# Patient Record
Sex: Female | Born: 1975 | Race: White | Hispanic: Yes | Marital: Single | Smoking: Never smoker
Health system: Western US, Academic
[De-identification: ages and names within clinical notes are randomized; demographics above are authoritative.]

## PROBLEM LIST (undated history)

## (undated) ENCOUNTER — Ambulatory Visit: Payer: Self-pay | Admitting: Ophthalmology

## (undated) DIAGNOSIS — H3320 Serous retinal detachment, unspecified eye: Secondary | ICD-10-CM

## (undated) HISTORY — DX: Serous retinal detachment, unspecified eye: H33.20

## (undated) SURGERY — REPAIR, ENTROPION
Anesthesia: Monitored Anesthesia Care (MAC) | Site: Face | Laterality: Left

## (undated) MED ORDER — TETRACAINE HCL 0.5 % OP SOLN
1.00 [drp] | OPHTHALMIC | Status: AC
Start: 2018-07-25 — End: 2018-07-25

## (undated) MED ORDER — TETRACAINE HCL 0.5 % OP SOLN
1.00 [drp] | OPHTHALMIC | Status: AC
Start: 2018-08-04 — End: 2018-08-04

## (undated) MED ORDER — TETRACAINE HCL 0.5 % OP SOLN
1.0000 [drp] | OPHTHALMIC | Status: AC
Start: 2018-03-25 — End: 2018-03-25

## (undated) MED ORDER — PREDNISOLONE ACETATE 1 % OP SUSP
1.0000 [drp] | OPHTHALMIC | Status: AC
Start: 2018-03-25 — End: 2018-03-25

## (undated) MED ORDER — MOXIFLOXACIN HCL 0.5 % OP SOLN
1.00 [drp] | OPHTHALMIC | Status: AC
Start: 2018-07-25 — End: 2018-07-25

## (undated) MED ORDER — MOXIFLOXACIN HCL 0.5 % OP SOLN
1.00 [drp] | OPHTHALMIC | Status: AC
Start: 2018-08-04 — End: 2018-08-04

## (undated) MED ORDER — PREDNISOLONE ACETATE 1 % OP SUSP
1.0000 [drp] | Freq: Three times a day (TID) | OPHTHALMIC | 11 refills | Status: AC
Start: 2022-04-11 — End: ?

## (undated) MED ORDER — PREDNISOLONE ACETATE 1 % OP SUSP
1.00 [drp] | OPHTHALMIC | Status: AC
Start: 2018-08-04 — End: 2018-08-04

## (undated) MED ORDER — PHENYLEPHRINE HCL 2.5 % OP SOLN
1.0000 [drp] | OPHTHALMIC | Status: AC
Start: 2018-03-25 — End: 2018-03-25

## (undated) MED ORDER — CYCLOPENTOLATE HCL 1 % OP SOLN
1.0000 [drp] | OPHTHALMIC | Status: AC
Start: 2018-03-25 — End: 2018-03-25

## (undated) MED ORDER — MOXIFLOXACIN HCL 0.5 % OP SOLN
1.0000 [drp] | OPHTHALMIC | Status: AC
Start: 2018-03-25 — End: 2018-03-25

## (undated) MED ORDER — CYCLOPENTOLATE HCL 1 % OP SOLN
1.00 [drp] | OPHTHALMIC | Status: AC
Start: 2018-08-04 — End: 2018-08-04

## (undated) MED ORDER — PHENYLEPHRINE HCL 2.5 % OP SOLN
1.00 [drp] | OPHTHALMIC | Status: AC
Start: 2018-08-04 — End: 2018-08-04

## (undated) MED ORDER — PREDNISOLONE ACETATE 1 % OP SUSP
1.00 [drp] | OPHTHALMIC | Status: AC
Start: 2018-07-25 — End: 2018-07-25

## (undated) MED ORDER — PREDNISOLONE ACETATE 1 % OP SUSP
1.0000 [drp] | Freq: Three times a day (TID) | OPHTHALMIC | 3 refills | Status: AC
Start: 2019-11-12 — End: ?

## (undated) MED ORDER — CYCLOPENTOLATE HCL 1 % OP SOLN
1.0000 [drp] | Freq: Four times a day (QID) | OPHTHALMIC | 0 refills | Status: AC
Start: 2018-05-13 — End: ?

## (undated) MED ORDER — CYCLOPENTOLATE HCL 1 % OP SOLN
1.00 [drp] | OPHTHALMIC | Status: AC
Start: 2018-07-25 — End: 2018-07-25

## (undated) MED ORDER — PHENYLEPHRINE HCL 2.5 % OP SOLN
1.00 [drp] | OPHTHALMIC | Status: AC
Start: 2018-07-25 — End: 2018-07-25

---

## 2018-03-13 ENCOUNTER — Emergency Department
Admission: EM | Admit: 2018-03-13 | Discharge: 2018-03-14 | Disposition: A | Discharge status: Home / Self Care | Payer: Worker's Comp, Other unspecified | Attending: Emergency Medicine | Admitting: Emergency Medicine

## 2018-03-13 ENCOUNTER — Emergency Department: Payer: Worker's Comp, Other unspecified | Admitting: Student in an Organized Health Care Education/Training Program

## 2018-03-13 ENCOUNTER — Emergency Department: Payer: Worker's Comp, Other unspecified

## 2018-03-13 ENCOUNTER — Encounter: Admission: EM | Disposition: A | Discharge status: Home Health | Payer: Self-pay | Attending: Emergency Medicine

## 2018-03-13 ENCOUNTER — Emergency Department (HOSPITAL_BASED_OUTPATIENT_CLINIC_OR_DEPARTMENT_OTHER): Payer: Worker's Comp, Other unspecified | Admitting: Student in an Organized Health Care Education/Training Program

## 2018-03-13 DIAGNOSIS — R51 Headache: Secondary | ICD-10-CM | POA: Insufficient documentation

## 2018-03-13 DIAGNOSIS — E669 Obesity, unspecified: Secondary | ICD-10-CM | POA: Insufficient documentation

## 2018-03-13 DIAGNOSIS — X17XXXA Contact with hot engines, machinery and tools, initial encounter: Secondary | ICD-10-CM

## 2018-03-13 DIAGNOSIS — Y998 Other external cause status: Secondary | ICD-10-CM | POA: Insufficient documentation

## 2018-03-13 DIAGNOSIS — Y9389 Activity, other specified: Secondary | ICD-10-CM | POA: Insufficient documentation

## 2018-03-13 DIAGNOSIS — W228XXA Striking against or struck by other objects, initial encounter: Secondary | ICD-10-CM | POA: Insufficient documentation

## 2018-03-13 DIAGNOSIS — Y9289 Other specified places as the place of occurrence of the external cause: Secondary | ICD-10-CM | POA: Insufficient documentation

## 2018-03-13 DIAGNOSIS — S0532XA Ocular laceration without prolapse or loss of intraocular tissue, left eye, initial encounter: Secondary | ICD-10-CM | POA: Insufficient documentation

## 2018-03-13 LAB — BASIC METABOLIC PANEL, BLOOD
BUN: 12 mg/dL (ref 7–25)
CO2: 25 mmol/L (ref 21–31)
Calcium: 11.4 mg/dL — ABNORMAL HIGH (ref 8.6–10.3)
Chloride: 102 mmol/L (ref 98–107)
Creat: 0.7 mg/dL (ref 0.6–1.2)
Electrolyte Balance: 7 mmol/L (ref 2–12)
Glucose: 97 mg/dL (ref 70–115)
Potassium: 4.1 mmol/L (ref 3.5–5.1)
Sodium: 134 mmol/L — ABNORMAL LOW (ref 136–145)
eGFR - high estimate: >60 (ref >59)
eGFR - low estimate: >60 (ref >59)

## 2018-03-13 LAB — CBC WITH DIFF, BLOOD
ANC automated: 8.4 10*3/uL — ABNORMAL HIGH (ref 2.0–8.1)
Basophils %: 0.7 %
Basophils Absolute: 0.1 10*3/uL (ref 0.0–0.2)
Eosinophils %: 0.3 %
Eosinophils Absolute: 0 10*3/uL (ref 0.0–0.5)
Hematocrit: 36 % (ref 34.0–44.0)
Hgb: 11.9 G/DL (ref 11.5–15.0)
Lymphocytes %: 16.8 %
Lymphocytes Absolute: 1.8 10*3/uL (ref 0.9–3.3)
MCH: 26.9 PG — ABNORMAL LOW (ref 27.0–33.5)
MCHC: 33.2 G/DL (ref 32.0–35.5)
MCV: 81.2 FL — ABNORMAL LOW (ref 81.5–97.0)
MPV: 7.4 FL (ref 7.2–11.7)
Monocytes %: 5.1 %
Monocytes Absolute: 0.6 10*3/uL (ref 0.0–0.8)
Neutrophils % (A): 77.1 %
PLT Count: 424 10*3/uL — ABNORMAL HIGH (ref 150–400)
RBC: 4.44 10*6/uL (ref 3.70–5.00)
RDW-CV: 15.3 % — ABNORMAL HIGH (ref 11.6–14.4)
White Bld Cell Count: 10.9 10*3/uL — ABNORMAL HIGH (ref 4.0–10.5)

## 2018-03-13 LAB — RETAIN BB SAMPLE

## 2018-03-13 LAB — PT/INR/PTT
INR: 1.02 (ref 0.88–1.12)
PTT: 27.9 s (ref 24.3–35.0)
Prothrombin Time: 13.4 s (ref 12.0–14.4)

## 2018-03-13 SURGERY — REPAIR, GLOBE, RUPTURED
Anesthesia: General | Laterality: Left | Wound class: Class I (Clean)

## 2018-03-13 MED ORDER — METHYLPREDNISOLONE SOD SUCCINATE OPHTHALMIC INJ 20 MG/ML (~~LOC~~)
SUBCONJUNCTIVAL | Status: DC | PRN
Start: 2018-03-13 — End: 2018-03-13
  Administered 2018-03-13 (×2): 0.5 mL via SUBCONJUNCTIVAL

## 2018-03-13 MED ORDER — HELP
0.3000 mL | Freq: Once | Status: DC
Start: 2018-03-13 — End: 2018-03-13

## 2018-03-13 MED ORDER — MEPERIDINE HCL 25 MG/ML IJ SOLN
12.5000 mg | INTRAMUSCULAR | Status: DC | PRN
Start: 2018-03-13 — End: 2018-03-14

## 2018-03-13 MED ORDER — PROCEDURAL SEDATION LIDOCAINE 2% - 20 ML (~~LOC~~)
INTRAMUSCULAR | Status: AC
Start: 2018-03-13 — End: 2018-03-13
  Filled 2018-03-13: qty 20

## 2018-03-13 MED ORDER — ACETAMINOPHEN 10 MG/ML IV SOLN
INTRAVENOUS | Status: DC | PRN
Start: 2018-03-13 — End: 2018-03-13
  Administered 2018-03-13: 1000 mg via INTRAVENOUS

## 2018-03-13 MED ORDER — SODIUM CHLORIDE 0.9 % IV SOLN
12.5000 mg | Freq: Once | INTRAVENOUS | Status: DC | PRN
Start: 2018-03-13 — End: 2018-03-14

## 2018-03-13 MED ORDER — METHYLPREDNISOLONE SODIUM SUCC 40 MG IJ SOLR CUSTOM
INTRAMUSCULAR | Status: AC
Start: 2018-03-13 — End: 2018-03-13
  Filled 2018-03-13: qty 40

## 2018-03-13 MED ORDER — VANCOMYCIN HCL 1 GM IV SOLR
1000.0000 mg | Freq: Once | INTRAVENOUS | Status: AC
Start: 2018-03-13 — End: 2018-03-13
  Administered 2018-03-13 (×2): 1000 mg via INTRAVENOUS
  Filled 2018-03-13: qty 1000

## 2018-03-13 MED ORDER — NALOXONE HCL 0.4 MG/ML IJ SOLN
0.2000 mg | Freq: Once | INTRAMUSCULAR | Status: DC | PRN
Start: 2018-03-13 — End: 2018-03-14

## 2018-03-13 MED ORDER — ERYTHROMYCIN 5 MG/GM OP OINT
TOPICAL_OINTMENT | OPHTHALMIC | Status: AC
Start: 2018-03-13 — End: 2018-03-13
  Filled 2018-03-13: qty 3.5

## 2018-03-13 MED ORDER — SUGAMMADEX SODIUM 200 MG/2ML IV SOLN
INTRAVENOUS | Status: AC
Start: 2018-03-13 — End: 2018-03-13
  Filled 2018-03-13: qty 2

## 2018-03-13 MED ORDER — ATROPINE SULFATE 1 % OP SOLN
OPHTHALMIC | Status: AC
Start: 2018-03-13 — End: 2018-03-13
  Filled 2018-03-13: qty 5

## 2018-03-13 MED ORDER — VANCOMYCIN PER PHARMACY (~~LOC~~)
INTRAVENOUS | Status: DC
Start: 2018-03-13 — End: 2018-03-13

## 2018-03-13 MED ORDER — ERYTHROMYCIN 5 MG/GM OP OINT
TOPICAL_OINTMENT | OPHTHALMIC | Status: DC | PRN
Start: 2018-03-13 — End: 2018-03-13
  Administered 2018-03-13: 1 via OPHTHALMIC

## 2018-03-13 MED ORDER — SODIUM HYALURONATE 10 MG/ML IO SOLN
INTRAOCULAR | Status: DC | PRN
Start: 2018-03-13 — End: 2018-03-13
  Administered 2018-03-13 (×2): 0.85 mL via INTRAOCULAR

## 2018-03-13 MED ORDER — TETANUS-DIPHTH-ACELL PERTUSSIS 5-2.5-18.5 LF-MCG/0.5 IM SUSP
0.5000 mL | Freq: Once | INTRAMUSCULAR | Status: AC
Start: 2018-03-13 — End: 2018-03-13
  Administered 2018-03-13: 0.5 mL via INTRAMUSCULAR
  Filled 2018-03-13: qty 0.5

## 2018-03-13 MED ORDER — BUPIVACAINE HCL (PF) 0.75 % IJ SOLN
INTRAMUSCULAR | Status: AC
Start: 2018-03-13 — End: 2018-03-13
  Filled 2018-03-13: qty 30

## 2018-03-13 MED ORDER — LACTATED RINGERS IV BOLUS (~~LOC~~)
1000.0000 mL | Freq: Once | INTRAVENOUS | Status: AC
Start: 2018-03-13 — End: 2018-03-13
  Administered 2018-03-13 (×2): 1000 mL via INTRAVENOUS

## 2018-03-13 MED ORDER — ONDANSETRON HCL 4 MG/2ML IV SOLN
INTRAMUSCULAR | Status: DC | PRN
Start: 2018-03-13 — End: 2018-03-13
  Administered 2018-03-13 (×2): 4 mg via INTRAVENOUS

## 2018-03-13 MED ORDER — CYCLOPENTOLATE HCL 1 % OP SOLN
OPHTHALMIC | Status: DC | PRN
Start: 2018-03-13 — End: 2018-03-13
  Administered 2018-03-13 (×2): 1 [drp] via OPHTHALMIC

## 2018-03-13 MED ORDER — PROPOFOL 200 MG/20ML IV EMUL
INTRAVENOUS | Status: DC | PRN
Start: 2018-03-13 — End: 2018-03-13
  Administered 2018-03-13: 140 mg via INTRAVENOUS

## 2018-03-13 MED ORDER — MOXIFLOXACIN HCL 0.5 % OP SOLN
OPHTHALMIC | Status: DC | PRN
Start: 2018-03-13 — End: 2018-03-13
  Administered 2018-03-13: 1 [drp] via OPHTHALMIC

## 2018-03-13 MED ORDER — LEVOFLOXACIN 750 MG OR TABS
750.0000 mg | ORAL_TABLET | Freq: Every day | ORAL | 0 refills | Status: DC
Start: 2018-03-13 — End: 2018-03-26

## 2018-03-13 MED ORDER — FENTANYL CITRATE (PF) 100 MCG/2ML IJ SOLN
50.0000 ug | INTRAMUSCULAR | Status: DC | PRN
Start: 2018-03-13 — End: 2018-03-14
  Administered 2018-03-14 (×2): 50 ug via INTRAVENOUS
  Filled 2018-03-13: qty 2

## 2018-03-13 MED ORDER — CYCLOPENTOLATE HCL 1 % OP SOLN
OPHTHALMIC | Status: AC
Start: 2018-03-13 — End: 2018-03-13
  Filled 2018-03-13: qty 2

## 2018-03-13 MED ORDER — STERILE WATER FOR INJECTION IJ SOLN
2000.0000 mg | Freq: Once | INTRAMUSCULAR | Status: AC
Start: 2018-03-13 — End: 2018-03-13
  Administered 2018-03-13 (×2): 2000 mg via INTRAVENOUS
  Filled 2018-03-13: qty 2000

## 2018-03-13 MED ORDER — PREDNISOLONE ACETATE 1 % OP SUSP
OPHTHALMIC | Status: AC
Start: 2018-03-13 — End: 2018-03-13
  Filled 2018-03-13: qty 5

## 2018-03-13 MED ORDER — FENTANYL CITRATE (PF) 100 MCG/2ML IJ SOLN
INTRAMUSCULAR | Status: DC | PRN
Start: 2018-03-13 — End: 2018-03-13
  Administered 2018-03-13: 25 ug via INTRAVENOUS
  Administered 2018-03-13: 50 ug via INTRAVENOUS

## 2018-03-13 MED ORDER — OXYCODONE HCL 5 MG OR TABS
5.0000 mg | ORAL_TABLET | ORAL | Status: DC | PRN
Start: 2018-03-13 — End: 2018-03-14

## 2018-03-13 MED ORDER — CYCLOPENTOLATE HCL 1 % OP SOLN
1.0000 [drp] | Freq: Four times a day (QID) | OPHTHALMIC | 0 refills | Status: DC
Start: 2018-03-13 — End: 2018-05-22

## 2018-03-13 MED ORDER — DEXAMETHASONE SODIUM PHOSPHATE 4 MG/ML IJ SOLN (CUSTOM)
INTRAMUSCULAR | Status: DC | PRN
Start: 2018-03-13 — End: 2018-03-13
  Administered 2018-03-13: 4 mg via INTRAVENOUS

## 2018-03-13 MED ORDER — ACETAMINOPHEN 10 MG/ML IV SOLN
INTRAVENOUS | Status: AC
Start: 2018-03-13 — End: 2018-03-13
  Filled 2018-03-13: qty 100

## 2018-03-13 MED ORDER — OXYCODONE HCL 5 MG OR TABS
10.0000 mg | ORAL_TABLET | ORAL | Status: DC | PRN
Start: 2018-03-13 — End: 2018-03-14
  Administered 2018-03-13: 10 mg via ORAL
  Filled 2018-03-13: qty 2

## 2018-03-13 MED ORDER — LACTATED RINGERS IV SOLN
INTRAVENOUS | Status: DC | PRN
Start: 2018-03-13 — End: 2018-03-13
  Administered 2018-03-13: 21:00:00 via INTRAVENOUS

## 2018-03-13 MED ORDER — GENTAMICIN SULFATE 40 MG/ML IJ SOLN
INTRAMUSCULAR | Status: AC
Start: 2018-03-13 — End: 2018-03-13
  Filled 2018-03-13: qty 2

## 2018-03-13 MED ORDER — HYDROCODONE-ACETAMINOPHEN 5-325 MG OR TABS
1.0000 | ORAL_TABLET | Freq: Once | ORAL | Status: DC
Start: 2018-03-13 — End: 2018-03-14

## 2018-03-13 MED ORDER — POVIDONE-IODINE 5 % OP SOLN
OPHTHALMIC | Status: AC
Start: 2018-03-13 — End: 2018-03-13
  Filled 2018-03-13: qty 60

## 2018-03-13 MED ORDER — LIDOCAINE HCL 20 MG/ML IV INJECTION WRAPPED RECORD
INTRAVENOUS | Status: DC | PRN
Start: 2018-03-13 — End: 2018-03-13
  Administered 2018-03-13 (×2): 70 mg via INTRAVENOUS

## 2018-03-13 MED ORDER — CEFAZOLIN OPHTHALMIC INJ 50 MG/ML (~~LOC~~)
SUBCONJUNCTIVAL | Status: DC | PRN
Start: 2018-03-13 — End: 2018-03-13
  Administered 2018-03-13 (×2): 0.5 mL via SUBCONJUNCTIVAL

## 2018-03-13 MED ORDER — CEFTAZIDIME OPHTHALMIC INJ 22.5 MG/ML (~~LOC~~)
0.3000 mL | SUBCONJUNCTIVAL | Status: DC
Start: 2018-03-13 — End: 2018-03-14
  Filled 2018-03-13: qty 0.3

## 2018-03-13 MED ORDER — SODIUM HYALURONATE 10 MG/ML IO SOLN
INTRAOCULAR | Status: AC
Start: 2018-03-13 — End: 2018-03-13
  Filled 2018-03-13: qty 0.85

## 2018-03-13 MED ORDER — CEFTAZIDIME OPHTHALMIC INJ 22.5 MG/ML (~~LOC~~)
SUBCONJUNCTIVAL | Status: DC | PRN
Start: 2018-03-13 — End: 2018-03-13
  Administered 2018-03-13 (×2): 0.3 mL via SUBCONJUNCTIVAL

## 2018-03-13 MED ORDER — HELP
Status: DC | PRN
Start: 2018-03-13 — End: 2018-03-13
  Administered 2018-03-13 (×2): 1 mL via INTRAOCULAR

## 2018-03-13 MED ORDER — POVIDONE-IODINE 5 % OP SOLN
OPHTHALMIC | Status: DC | PRN
Start: 2018-03-13 — End: 2018-03-13
  Administered 2018-03-13: 1 [drp] via OPHTHALMIC

## 2018-03-13 MED ORDER — CEFAZOLIN SODIUM 1 GM IJ SOLR
INTRAMUSCULAR | Status: AC
Start: 2018-03-13 — End: 2018-03-13
  Filled 2018-03-13: qty 1000

## 2018-03-13 MED ORDER — ROCURONIUM BROMIDE 50 MG/5ML IV SOSY
PREFILLED_SYRINGE | INTRAVENOUS | Status: DC | PRN
Start: 2018-03-13 — End: 2018-03-13
  Administered 2018-03-13: 10 mg via INTRAVENOUS
  Administered 2018-03-13: 90 mg via INTRAVENOUS

## 2018-03-13 MED ORDER — LACTATED RINGERS IV SOLN
INTRAVENOUS | Status: DC
Start: 2018-03-13 — End: 2018-03-14

## 2018-03-13 MED ORDER — MOXIFLOXACIN HCL 0.5 % OP SOLN
OPHTHALMIC | Status: AC
Start: 2018-03-13 — End: 2018-03-13
  Filled 2018-03-13: qty 3

## 2018-03-13 MED ORDER — HYDROCODONE-ACETAMINOPHEN 5-325 MG OR TABS
1.0000 | ORAL_TABLET | ORAL | Status: DC | PRN
Start: 2018-03-13 — End: 2018-03-14

## 2018-03-13 MED ORDER — PREDNISOLONE ACETATE 1 % OP SUSP
1.0000 [drp] | Freq: Four times a day (QID) | OPHTHALMIC | 0 refills | Status: DC
Start: 2018-03-13 — End: 2018-06-06

## 2018-03-13 MED ORDER — BSS IO SOLN
INTRAOCULAR | Status: AC
Start: 2018-03-13 — End: 2018-03-13
  Filled 2018-03-13: qty 30

## 2018-03-13 MED ORDER — HELP
Status: DC | PRN
Start: 2018-03-13 — End: 2018-03-13
  Administered 2018-03-13 (×2): .3 mL via INTRAOCULAR

## 2018-03-13 MED ORDER — PROPOFOL 200 MG/20ML IV EMUL
INTRAVENOUS | Status: AC
Start: 2018-03-13 — End: 2018-03-13
  Filled 2018-03-13: qty 20

## 2018-03-13 MED ORDER — MIDAZOLAM HCL (PF) 2 MG/2ML IJ SOLN
INTRAMUSCULAR | Status: AC
Start: 2018-03-13 — End: 2018-03-13
  Filled 2018-03-13: qty 2

## 2018-03-13 MED ORDER — MIDAZOLAM HCL 2 MG/2ML IJ SOLN
INTRAMUSCULAR | Status: DC | PRN
Start: 2018-03-13 — End: 2018-03-13
  Administered 2018-03-13 (×2): 2 mg via INTRAVENOUS

## 2018-03-13 MED ORDER — BSS IO SOLN
INTRAOCULAR | Status: DC | PRN
Start: 2018-03-13 — End: 2018-03-13
  Administered 2018-03-13 (×2): 15 mL via INTRAOCULAR

## 2018-03-13 MED ORDER — NA CHONDROIT SULF-NA HYALURON 40-30 MG/ML IO SOLN
INTRAOCULAR | Status: AC
Start: 2018-03-13 — End: 2018-03-13
  Filled 2018-03-13: qty 0.5

## 2018-03-13 MED ORDER — VANCOMYCIN OPHTHALMIC INJ 10 MG/ML (~~LOC~~)
0.3000 mL | SUBCONJUNCTIVAL | Status: DC
Start: 2018-03-13 — End: 2018-03-14
  Filled 2018-03-13: qty 0.3

## 2018-03-13 MED ORDER — FENTANYL CITRATE (PF) 100 MCG/2ML IJ SOLN
INTRAMUSCULAR | Status: AC
Start: 2018-03-13 — End: 2018-03-13
  Filled 2018-03-13: qty 2

## 2018-03-13 MED ORDER — MOXIFLOXACIN HCL 0.5 % OP SOLN
1.0000 [drp] | Freq: Four times a day (QID) | OPHTHALMIC | 0 refills | Status: DC
Start: 2018-03-13 — End: 2018-05-22

## 2018-03-13 MED ORDER — BSS IO SOLN
INTRAOCULAR | Status: AC
Start: 2018-03-13 — End: 2018-03-13
  Filled 2018-03-13: qty 45

## 2018-03-13 MED ORDER — SUGAMMADEX SODIUM 200 MG/2ML IV SOLN
INTRAVENOUS | Status: DC | PRN
Start: 2018-03-13 — End: 2018-03-13
  Administered 2018-03-13: 200 mg via INTRAVENOUS

## 2018-03-13 MED ORDER — TRIAMCINOLONE ACETONIDE 40 MG/ML IJ SUSP
INTRAMUSCULAR | Status: AC
Start: 2018-03-13 — End: 2018-03-13
  Filled 2018-03-13: qty 1

## 2018-03-13 SURGICAL SUPPLY — 8 items
APPLICATOR COTTON TIP 6" STER (Misc Surgical Supply) ×2 IMPLANT
CANISTER SUCTION POROUS 2000 CC RIGID LOCK LID STANDARD ELBOW PORT SHUTOFF VALVE LATEX FREE DISPOSAB (Misc Medical Supply) ×2 IMPLANT
GLOVES SUPERSEN 7.0 LTX PF (Gloves/gowns) ×2 IMPLANT
NEEDLE 30G X 1/2" REG BEVEL (Needles/punch/cannula/biopsy) ×2 IMPLANT
PACK MINOR REV 7 (Procedure Packs/kits) ×2 IMPLANT
SPEARS SURGICAL (Disp Instruments) ×8 IMPLANT
SYRINGE 5ML LUER LOCK TIP (Misc Medical Supply) ×2 IMPLANT
TOWELS, STERILE BLUE (Drapes/towels) ×2 IMPLANT

## 2018-03-13 NOTE — ED Notes (Signed)
Per pharmacy, vancomycin eye injection must be picked up from pharmacy. Silvi from pre-op made aware.

## 2018-03-13 NOTE — ED Notes (Signed)
Received report from Jake RN.

## 2018-03-13 NOTE — ED Notes (Addendum)
Report given to [facility] RN in pre-op.

## 2018-03-13 NOTE — Anesthesia Postprocedure Evaluation (Signed)
Anesthesia Post Note    Patient: Natalie Webb    Procedure(s) Performed: Procedure(s):  open globe repair of the left eye      Final anesthesia type: General    Patient location: PACU    Post anesthesia pain: adequate analgesia    Mental status: awake, alert  and oriented    Airway Patent: Yes    Last Vitals:    Vitals Value Taken Time   BP 156/84 03/13/2018 11:20 PM   Temp 36.8    Pulse 96 03/13/2018 11:20 PM   Resp 16 03/13/2018 11:20 PM   SpO2 100 % 03/13/2018 11:27 PM       Temperature >35.5: Yes    Post vital signs: stable    Hydration: adequate    N/V:no    Plan of care per primary team.

## 2018-03-13 NOTE — Consults (Signed)
Ophthalmology Consultation    Patient : Natalie Webb; 43 year old  MRN#: [id number]    Reason for Consultation:  Left eye trauma     History of Present Illness:   Natalie Webb is a 43 year old female with no POH with PMH of anemia here for left eye trauma.  Patient works at a [employer].  This morning she reportedly bent down where the rotating printing press hit her left eye.  She does not think anything went into her eye.  She immediately felt pain and loss of vision.     Right eye asymptomatic.     Past Medical History:  History reviewed. No pertinent past medical history.  No past surgical history on file.    Past Ocular History:   None     Medications:  No current facility-administered medications for this encounter.      No current outpatient medications on file.       Allergies:  No Known Allergies    Family History:  No family history on file.    Social History:  Social History     Patient does not qualify to have social determinant information on file (likely too young).   Social History Narrative   . Not on file       Review of Systems: A review of systems was performed and noted to be negative except as stated in history of present illness.    Physical Examination:  Vitals Blood pressure 174/83, pulse 58, temperature 98.4 F (36.9 C), resp. rate 16, height 5' (1.524 m), weight 76.2 kg (167 lb 15.9 oz), last menstrual period 03/13/2018, SpO2 100 %.    General Pain, anxious        Right Eye Left Eye   Visual Acuity 20/30 HM   Pupil ERRL ERRL   Afferent Pupillary Defect None + by reverse   Motility Full Full   Confrontation Visual Field Full HM    Intraocular Pressure 19 Unable         Anterior Segment Exam     Lids / Lashes Normal Mild eyelid edema    Conjunctiva / Sclera White and Quiet Conjunctival injection with prolapsed uveal tissue nasal to cornea    Cornea Clear + D folds   Anterior Chamber Deep and Quiet Soft globe, flattened AC, near 100% hyphema   Iris Round and Flat No view   Lens Tr NS No view        Dilated Funduscopic Exam   (performed after instillation of 2.5% phenylephrine and 1% tropicamide)   Right Eye Left Eye   Vitreous Clear; No Cell No view   Cup-to-disc Ratio 0.7 No view   Optic Nerve Webb margins; No edema No view   Macula Normal No view   Vessels Normal No view   Periphery Normal, no RT/RD, no commotio, no hemorrhages  No view       CT Head prelim read  There is gross deformity of the left globe, suspicious for globe rupture with associated hemorrhage. There is surrounding extraconal hematoma and soft tissue stranding. There is mild stranding surrounding the lateral rectus. There is no evidence of orbital fracture. No radiodense foreign body is identified.    B-scan left eye: unable given concern for open globe    Assessment and Plan:  1. Left eye open globe   Recommendations:  - operating room for globe exploration and repair: R/B/A for surgery discussed, patient consented  - NPO  - IV vancomycin/ceftazidime  -  tetanus booster  - keep eye shield over left eye at all times  - no straining, no heavy lifting  - pre op planning: CBC, coags, BMP, EKG  - eye marked         2. Glaucoma suspect based on C/D ratio  - unknown FHx  - outpatient workup with HVF, OCTnfl once above stabilized         [place] R. Jerline Pain, MD  Ophthalmology Resident, PGY3  Staffed with Attending Randolm Idol     Spanish interpreter #: [phone]

## 2018-03-13 NOTE — ED Notes (Addendum)
Spoke with Silvi from pre-op; said to have medications tubed to ER and can bring pt to OR while medications in progress. Pt refused pain medication at this time.

## 2018-03-13 NOTE — ED EKG Interpretation (Signed)
ED EKG Interpretation    EKG: Normal Sinus Rhythm with Normal Axis and no ischemic changes.

## 2018-03-13 NOTE — ED Provider Notes (Signed)
EMERGENCY DEPARTMENT VISIT NOTE    CHIEF COMPLAINT  Eye Pain (0700 pt was at work and hit left eye, was seen by a specialist and was told she may need surgery, pt states unable to see from left eye )      HISTORY OF PRESENT ILLNESS:   Natalie Webb is a 43 year old female who presents with eye trauma. Patient was working, at [employer], patient was working on a machine that spins, part of the machine hit her face and she fell backwards onto bottom. Complains of L eye. Patient could not see after the event. Sustained laceration to the eye.     Patient went to other hosptial, sent here for optho evaluation.     Onset: 11 hours  Position: L eye  Quality: Sharp   Radiation: Head  Severity: Moderate  Timing: Worsening    REVIEW OF SYSTEMS:  Constitutional: - fever  Head: + headache  CV: - chest pain  Resp: - shortness of breath  GI: - vomiting  Back: - back pain  Skin: - rash  Neuro: - focal weakness    All other systems reviewed and negative except as noted above    PAST MEDICAL HISTORY:  Anemia    SURGICAL HISTORY:  C/s section    ALLERGIES:  No Known Allergies      CURRENT MEDICATIONS:   Iron    FAMILY HISTORY:  Reviewed and noncontributory      SOCIAL HISTORY:  - Tobacco  - Alcohol  - Drug use    VITAL SIGNS:  First Vitals [03/13/18 1551]   Temperature Heart Rate Respirations Blood pressure (BP) SpO2   98.4 F (36.9 C) 58 16 174/83 100 %       PHYSICAL EXAM:  Gen: awake, alert, appears stated age    Head: normocephalic, atraumatic, moist mucous membranes    Neck: no swelling, full ROM    Eyes: L eye with hyphema, 50%, unable to visualize pupil, R eye with full vision, reactive pupil  ENT: normal appearing ears externally, normal appearing nose externally    CV: normal  rate, skin is warm and well perfused  Pulm: normal chest rise, breathing comfortably, no audible wheezing or stridor    Abdomen: Soft, non-distended,   Extremities: no peripheral edema, cyanosis, or clubbing    Skin: no rashes or significant lesions visible       Neuro: awake, alert, moving all extremities    Psych: conversing appropriately, normal affect      MEDICAL DECISION MAKING:  Natalie Webb is a 43 year old female who presents with L eye trauma, decreased vision.   VS: WNL  Exam: Hyphema, decreased visual acuity    Differential Diagnosis Includes: rupture, laceration, hyphema, hematoma    Plan:  - Labs  - CT head  - Optho consult    ED Course:  I reviewed the patient's labs which showed all labs without clinically significant abnormalities   I reviewed the patient's imaging which showed concern for open globe.  Opthalmology was consulted who states patient will need to go to the OR for operative management.    I have also reviewed prior records from OSH which were summarized in my HPI.  Started on vancomycin, ceftazidime, made NPO and placed on maintenance fluid.     Additional ED Course Documentation:  Workup Review as of Mar 13 2124   Fransico Him R's Documentation   Thu Mar 13, 2018   1610 Per optho, patient has ruptured globe  and will need operative management. Will admit to OR. Will keep NPO.       1722 Optho called, will come evaluate.          Others' Documentation   Thu Mar 13, 2018   1848 Ruptured globe on CT. Per ophtho, needs OR. Needs EKG, vanc/ceftaz.     [TM]      Workup Review User Index  [TM] Myatt, Roxanne Mins, MD        Clinical Impression:  Patient's presentation most consistent with globe ruptured.     Dispo:  - Admit to OR    DIAGNOSIS:    ICD-10-CM ICD-9-CM   1. Ruptured globe of left eye, initial encounter S05.32XA 871.0   2. Ruptured globe of left eye, initial encounter S05.32XA 871.0       ATTENDING ATTESTATION:  I saw and evaluated the patient and discussed the case with the resident/NP.   I agree with the final findings and plan as documented in the record.   We formulated the assessment and plan together.  My additions or revisions are included in the record.    Fransico Him MD        Kem Boroughs, MD  03/13/18 2126

## 2018-03-13 NOTE — ED Triage Notes (Signed)
Pt appears sleepy and states she had morphine IV from the other hospital PTA.

## 2018-03-13 NOTE — ED Notes (Signed)
Optho MD transporting patient to [facility] eye room.

## 2018-03-13 NOTE — Anesthesia Preprocedure Evaluation (Addendum)
ANESTHESIA PRE-OPERATIVE EVALUATION    Patient Information    Name: Natalie Webb    MRN: [id number]    DOB: [birth year]    Age: 43 year old    Sex: female  Procedure(s):  REPAIR, GLOBE, RUPTURED      Pre-op Vitals:   BP 180/80 (BP Location: Right arm, BP Patient Position: Semi-Fowlers)   Pulse 75   Temp 36.7 C   Resp 17   Ht 5' (1.524 m)   Wt 76.2 kg (167 lb 15.9 oz)   LMP 03/13/2018   SpO2 98%   BMI 32.81 kg/m         Primary language spoken:  Spanish    ROS/Medical History:       History of Present Illness: 43 yo F with left ruptured globe 2/27 sf repair of left ruptured globe        General:  positive for Obesity,  #Social  -Nonsmoker  -No alcohol  -No drugs   Cardiovascular:  negative cardio ROS  EKG (03/13/18): NSR, 63 bpm   Anesthesia History:  #PShx  -C/s Pulmonary:   negative pulmonary ROS     Neuro/Psych:   #Rupture, left globe    -[facility] 2/27: Gross deformity of the left globe, suspicious for globe rupture with associated hemorrhage. There is surrounding extraconal hematoma and soft tissue stranding. No evidence of orbital fracture. No radiodense foreign body is identified.    No acute or focal significant intracranial abnormality. Hematology/Oncology:   anemia,      GI/Hepatic:  negative GI/hepatic ROS Infectious Disease:  open wound (L globe),     Renal:  negative renal ROS   Endocrine/Other:  negative endo/other ROS     Pregnancy History:  Currently pregnant: no,   Pediatrics:    negative pediatric ROS       Pre Anesthesia Testing (PCC/CPC) notes/comments:                          NPO at noon-fruit, melon and water. Harris 03/13/18               Physical Exam    Airway:  Inter-inciser distance > 4 cm  Prognanth Unable    Mallampati: III  Neck ROM: full  TM distance: > 6 cm  Short thick neck: No        Cardiovascular:    Rhythm: regular   Rate: normal         Pulmonary:  - pulmonary exam normal           Neuro/Neck/Skeletal/Skin:      Dental:  - normal exam    Abdominal:              Last  OSA (STOP  BANG) Score:  No data recorded    Last OSA  (STOP) Score for   No data recorded                 History reviewed. No pertinent past medical history.  No past surgical history on file.  Social History     Tobacco Use   . Smoking status: Never Smoker   . Smokeless tobacco: Never Used   Substance Use Topics   . Alcohol use: Never     Frequency: Never   . Drug use: Not on file       Current Facility-Administered Medications   Medication Dose Route Frequency Provider Last Rate Last Dose   . [MAR Hold] cefTAZidime 22.5 mg/mL  ophthalmic injection 0.3 mL  0.3 mL Intravitreal On Call to [place], Gwenette Greet, MD       . Doug Sou Hold] HYDROcodone-acetaminophen (NORCO) 5-325 MG tablet 1 tablet  1 tablet Oral Once Sherre Scarlet, MD   Stopped at 03/13/18 1855   . vancomycin (VANCOCIN) 1,000 mg in sodium chloride 0.9 % 250 mL IVPB  1,000 mg IntraVENOUS Once Myatt, Toby C, MD 250 mL/hr at 03/13/18 1949 1,000 mg at 03/13/18 1949   . [MAR Hold] vancomycin 10 mg/mL ophthalmic injection 0.3 mL  0.3 mL Intravitreal On Call to OR Vu, Gwenette Greet, MD         No Known Allergies    Labs and Other Data  Lab Results   Component Value Date    SODIUM 134 (L) 03/13/2018    K 4.1 03/13/2018    CL 102 03/13/2018    BUN 12 03/13/2018    CREAT 0.7 03/13/2018    GLU 97 03/13/2018    [place] 11.4 (H) 03/13/2018     No results found for: AST, ALT, GGT, LDH, ALK, TP, ALB, TBILI, DBILI  Lab Results   Component Value Date    RBC 4.44 03/13/2018    HGB 11.9 03/13/2018    HCT 36.0 03/13/2018    MCV 81.2 (L) 03/13/2018    MCHC 33.2 03/13/2018    RDW 15.3 (H) 03/13/2018    PLT 424 (H) 03/13/2018    MPVH 7.4 03/13/2018     Lab Results   Component Value Date    INR 1.02 03/13/2018    PTT 27.9 03/13/2018     No results found for: ARTPH, ARTPO2, ARTPCO2    Anesthesia Plan:  Risks and Benefits of Anesthesia  I personally examined the patient immediately prior to the anesthetic and reviewed the pertinent medical history, drug and allergy history, laboratory and imaging  studies and consultations. I have determined that the patient has had adequate assessment and testing.    Anesthetic techniques, invasive monitors, anesthetic drugs for induction, maintenance and post-operative analgesia, risks and alternatives have been explained to the patient and/or patient's representatives.    I have prescribed the anesthetic plan:         Planned anesthesia method: General         ASA 2 (Mild systemic disease)     Potential anesthesia problems identified and risks including but not limited to the following were discussed with patient and/or patient's representative: Injury to brain, heart and other organs    No Beta Blocker Indicated: Does not meet criteriaPlanned monitoring method: Routine monitoring    Informed Consent:  Interpreter used: Over phone  Anesthetic plan and risks discussed with Patient and Other.

## 2018-03-13 NOTE — ED Notes (Signed)
Spoke with patient, states she was at work when something injured her left eye. Asked patient if she can see; states " A little bit." Pt is a/ox4, breathing is even and unlabored on room air.

## 2018-03-13 NOTE — Anesthesia Procedure Notes (Signed)
Intubation  Date/Time: 03/13/2018 8:55 PM  Performed by: Carolynn Comment, MD  Authorized by: Osvaldo Angst, MD     Procedure Details:     Pre-oxygenated: Yes      Induction:  IV and RSI    Blade:  Glidescope 3    View Grade:  1    Tube type:  Oral cuffed    Tube size (mm):  6.5    Tube Secured at (cm):  20    Cricoid pressure: Yes      Stylet: Yes      Bougie Used: No      Dental damage: No      Lip/Other damage: No      Bite block inserted:  Gauze    ET-CO2 present: Yes      Breath sounds equal: Yes      Eyes taped: Yes

## 2018-03-13 NOTE — Op Note (Signed)
Eye Globe Rupture Repair Operative Note     DATE OF OPERATION: 03/13/18       PREOPERATIVE DIAGNOSIS:  Ruptured globe, left eye.     POSTOPERATIVE DIAGNOSIS:  Ruptured globe, left eye.     PROCEDURE PERFORMED: Left eye globe rupture repair with anterior chamber washout     SURGEON: Molli Hazard MD     ASSISTANT SURGEON: Carolynn Serve MD, PGY-4     ANESTHESIA:  General.     COMPLICATIONS:  None  .  ESTIMATED BLOOD LOSS:  Less than 5 mL.     DISPOSITION:  To the recovery room in good condition  .  INDICATIONS FOR PROCEDURE:  This is a 43 year old year old female presenting for left eye ruptured globe from work-related left eye trauma. Vision in the involved eye upon arrival to the emergency room was hand motion. Patient was noted to have an open globe with a nasal scleral laceration with prolapsed uveal tissue and a 100% hyphema.         DESCRIPTION OF OPERATION:   After informed consent had been obtained, the patient was taken back to the operating room where cardiac and blood pressure monitoring devices were applied. The patient was gently prepped with Betadine paint for procedure of the  involved eye. Special care was taken not to put pressure on the globe, or allow betadine to enter the globe. A lid speculum was gently inserted, and on examination, it was apparent that the laceration extended perilimbally from 1 to  9 oc along the limbus and extended nasally into the scleral about 10 mm. Uveal tissue (possibly iris and ciliary body) had prolapsed through the wound. The Anterior chamber was noted to have a dense 100% hyphema.     A 180-degree peritomy was performed taking care not to put pressure on the globe.   Wescott scissors were used to dissect down to bare sclera and posteriorly in all four quadrants. No other lacerations were noted on the globe. A cyclodialysis spatula was then used to reposition the prolapsed uveal tissue, likely iris and ciliary body, inside the anterior and posterior chamber. 9.0 nylon  sutures were used to close the limbus. 8.0 nylon sutures were used to close the scleral laceration, taking care to avoid incarcerating the prolapsed uveal tissue.     Before all sutures were placed, the anterior chamber was irrigated with basic saline solution on a cannula in attempt to washout the hyphema. The hyphema had formed a densely coagulated clot, and was therefore very resistant to removal. The anterior chamber was washed several times however the entirety of the anterior chamber blood clot could not be removed safely. The decision was made to treat the clot medically post operatively. The anterior chamber at this time was noted to be formed, with persistent hyphema. Multiple interrupted sutures were placed until the wounds were securely closed. All wounds were confirmed to be watertight.    Next, attention was turned toward the conjunctival peritomy. The peritomy was then sutured closed, such that the conjunctiva was well apposed at the end of the case with 7.0 vicryl. Betadine was placed on the eye. Intravitreal vancomycin (0.7 mL) and ceftazidime (0.7 mL) was injected. Injections of Ancef and Solumedrol were administered into the subconjunctival space inferotemporally. The surface of the eye was washed off with balance salt saline. Antibiotic drops and steroids and cyclopentolate was given. Erythromycin Ointment was placed on the eye. A pressure patch was placed on the eye with an overlying  eye shield. The IOP at this time was estimated to be about . The patient was extubated and taken back to the recovery room in good condition and to followup within 1 day.

## 2018-03-13 NOTE — ED Notes (Signed)
Pt transport to main OR via w/c. Pt a/ox4, breathing is even and unlabored on room air

## 2018-03-13 NOTE — ED Notes (Signed)
Report given to Philip in OR

## 2018-03-13 NOTE — Discharge Instructions (Signed)
Discharge Instructions: Open Globe Repair, Left Eye    What to expect    After a major eye surgery, it is normal for the eye to be red, mildly painful, and very blurry. The eye will also likely feel like there is something stuck in it. It is also normal after general anesthesia to experience nausea, vomiting, headache, light-headedness, and dizziness.    Medications    For the first 24 hours after surgery, you do not need to administer any eye drops. Just leave the eye shield in place until you see your surgeon the day after surgery.    Take Levaquin orally daily for 10 days, this has been sent to your pharmacy for pickup.    For pain, take Acetaminophen (Tylenol) 325-650 mg every 4 hours if you have no history of liver problems.    After you see your surgeon the day after surgery, start using your eye drop medications as follows unless instructed differently:        Left EYE       Prednisolone   (White or Pink Top)   4 x / day     Ofloxacin   (Brown Top)   4 x / day     Cyclopentolate   (Red Top)   4 x / day     Wound care/Activity Restrictions    Leave your eye shield/patch on until follow up tomorrow. If you develop any discharge or crusting, you can gently clean the area with a clean, damp wash cloth.     Take the following precautions for at least 2 weeks until instructed otherwise by your surgeon:     Do not rub your eye   Wear your protective eye shield while sleeping at night    No heavy lifting, bending over at the waist, or straining   Do not play or work in dirt or sand   Do not swim or submerge the eye under water   Shower and/or bathe from the neck down only   Wash hair backwards, keeping the eye and the skin around the eye dry   If possible, sleep on several pillows to keep your head elevated   Wear protective dark sunglasses when outdoors     It is OK to resume your usual diet, watch TV, read, etc.      When to call and return promptly    If you develop sudden decreased vision, eye pain,  redness, flashing lights, new or worsening floaters, or a shadow/curtain moving in front of or covering your vision, you need to be examined immediately. During business hours, call the eye clinic. At night or on weekends, go to the emergency room.    Phone Numbers  [phone] Kennon Holter   [phone] [facility]    Follow up  The patient is to follow-up in our clinic on Friday 03/14/2017 at 1 PM with Dr. Renold Don at:  [facility], 2nd floor  [address] [place], [place]  [phone]    It is important for you to follow up and be compliant, otherwise you may risk permanent, irreversible vision loss.

## 2018-03-14 ENCOUNTER — Ambulatory Visit: Payer: Worker's Comp, Other unspecified | Attending: Ophthalmology

## 2018-03-14 DIAGNOSIS — S0532XA Ocular laceration without prolapse or loss of intraocular tissue, left eye, initial encounter: Principal | ICD-10-CM | POA: Insufficient documentation

## 2018-03-14 LAB — ECG 12-LEAD
P AXIS: 96 Deg
PR INTERVAL: 147 ms
QRS INTERVAL/DURATION: 96 ms
QT: 396 ms
QTc (Bazett): 403 ms
R AXIS: 0 Deg
R-R INTERVAL AVERAGE: 949 ms
T AXIS: 22 Deg
VENTRICULAR RATE: 63 {beats}/min

## 2018-03-14 MED ORDER — MOXIFLOXACIN HCL 400 MG OR TABS
400.0000 mg | ORAL_TABLET | Freq: Every day | ORAL | 0 refills | Status: AC
Start: 2018-03-14 — End: 2018-03-24

## 2018-03-14 NOTE — Plan of Care (Signed)
Problem: Promotion of Perioperative Health and Safety  Goal: Promotion of Health and Safety of the Perioperative Patient  Description  The patient remains safe, receives treatment appropriate to the surgical intervention and patient's physiological needs and is discharged or transferred to the appropriate level of care.  Outcome: Progressing  Flowsheets (Taken 03/14/2018 0011)  Standard of Care: PACU SOC  Outcome Evaluation (rationale for progressing/not progessing: vital signs stable; pain better after pain med given  Individualized Interventions and Recommendations: assess for post op pain and offer painmeds as needed  Individualized Interventions/Recommendations: monitor vital signs

## 2018-03-14 NOTE — Progress Notes (Signed)
B-scan left eye 03/14/2018: formed round globe with disorganized posterior segment, many vitreous opacities/hemorrhage and likely retinal detachment    Assessment and Plan:  1. 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  Vision potential: hand motion on presentation, currently light perception  S/p repair 03/13/2018  Limbal laceration extending from 1-9oc superonasally with scleral extension nasally  IOP acceptable, globe formed today with poor view posteriorly  Bscan showing disorganized posterior segment, like hemorrhages, possible retinal detachment    Plan:  - Keep head of bed elevated; avoid blood thinners, excessive straining, heavy lifting, bending for 2-3 weeks at least  - Retinal detachment and endophthalmitis return precautions explained  - no eye rubbing, eye shield at night, no getting eye wet or dirt into eye  - Start drops:   Vigamox QID OS   Prednisolone QID OS   Cyclopentolate QID OS  - Oral Antibiotics: Moxifloxacin 400 mg daily x 1 week    - Strict return precautions given for any worsening in symptoms, including pain and vision changes  The patient is to follow-up in our clinic on 03/20/2018 retina clinic for bscan  [facility], 2nd floor  [address] [place], [place]  [phone]             2. Glaucoma suspect based on C/D ratio  - unknown FHx  - outpatient workup with HVF, OCTnfl once above stabilized     Carolynn Serve, MD  PGY4, Ophthalmology  Seen with Dr. Katrinka Blazing  Discussed with Dr. Randolm Idol    The patient was provided with a follow-up card with the above information. The pathophysiology of the above diagnosis was explained to the patient, the importance of compliance and follow-up, as well as the possibility of irreversible vision loss if patient does not follow-up or is not compliant. The patient endorsed understanding.

## 2018-03-14 NOTE — Anesthesia Postprocedure Evaluation (Signed)
Anesthesia Post Note    Patient: Natalie Webb    Procedure(s) Performed: Procedure(s):  open globe repair of the left eye      Final anesthesia type: General    Patient location: PACU    Post anesthesia pain: adequate analgesia    Mental status: awake, alert  and oriented    Airway Patent: Yes    Last Vitals:    Vitals Value Taken Time   BP 144/80 03/14/2018 12:30 AM   Temp 36.2 C 03/13/2018 11:45 PM   Pulse 75 03/14/2018 12:30 AM   Resp 14 03/14/2018 12:30 AM   SpO2 96 % 03/14/2018 12:30 AM        Temperature >35.5: Yes    Post vital signs: stable    Hydration: adequate    N/V:no    Plan of care per primary team.

## 2018-03-20 ENCOUNTER — Ambulatory Visit: Payer: Worker's Comp, Other unspecified | Attending: Ophthalmology

## 2018-03-20 ENCOUNTER — Telehealth: Payer: Self-pay

## 2018-03-20 ENCOUNTER — Ambulatory Visit: Payer: Self-pay

## 2018-03-20 DIAGNOSIS — S0532XA Ocular laceration without prolapse or loss of intraocular tissue, left eye, initial encounter: Secondary | ICD-10-CM | POA: Insufficient documentation

## 2018-03-20 DIAGNOSIS — H40001 Preglaucoma, unspecified, right eye: Secondary | ICD-10-CM | POA: Insufficient documentation

## 2018-03-20 DIAGNOSIS — S0532XD Ocular laceration without prolapse or loss of intraocular tissue, left eye, subsequent encounter: Secondary | ICD-10-CM | POA: Insufficient documentation

## 2018-03-20 MED ORDER — PREDNISONE 10 MG OR TABS
20.0000 mg | ORAL_TABLET | Freq: Every day | ORAL | 0 refills | Status: DC
Start: 2018-03-20 — End: 2018-03-27

## 2018-03-20 NOTE — Telephone Encounter (Addendum)
Patient missed appt today in the retina clinic. She is Natalie Webb to 04/17/18 but is requesting something sooner since her eye is bothering her please call her at [phone]

## 2018-03-20 NOTE — Telephone Encounter (Signed)
[facility] is calling to see if we can offer the pt something sooner states they pt sutra seem to be coming out xfer to clinic spoke w/ [facility]

## 2018-03-20 NOTE — Progress Notes (Addendum)
B-scan left eye 03/14/2018: formed round globe with disorganized posterior segment, many vitreous opacities/hemorrhage and likely retinal detachment    Assessment and Plan:  1. 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 Erroll Luna  Will start patient on po pred 40 mg for pain, will have pt follow up 03/24/18, poor surgical prognosis. Will also have glaucoma service eval at that time as well   Continue pred forte, vigamox, and cyclo QID     Plan:  - Keep head of bed elevated; avoid blood thinners, excessive straining, heavy lifting, bending for 2-3 weeks at least  - Retinal detachment and endophthalmitis return precautions explained  - no eye rubbing, eye shield at night, no getting eye wet or dirt into eye    - Oral Antibiotics: Moxifloxacin 400 mg daily x 1 week    - Strict return precautions given for any worsening in symptoms, including pain and vision changes               2. 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                03/24/18 PM to see Retina special arrangements - poor visual prognosis and  surgical prognosis after review w Dr. Erroll Luna, but will have pt be seen again  for evaluation and monitor for pain    I reviewed and confirmed the techs ROS, past histories, and  readings. I saw and examined the patient and reviewed in detail the findings of the resident/fellow. The final examination findings, image interpretations, and plan as documented in the record represent my personal judgment and conclusions.     Molli Hazard, MD  Vitreoretinal Surgery Fellow  [facility]

## 2018-03-20 NOTE — Progress Notes (Deleted)
1. 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  -Vision potential: hand motion on presentation, currently light perception  -Limbal laceration extending from 1-9oc superonasally with scleral extension nasally  -IOP acceptable, globe formed today with poor view posteriorly  -Bscan showing disorganized posterior segment, like hemorrhages, possible retinal detachment    Plan:  - Keep head of bed elevated; avoid blood thinners, excessive straining, heavy lifting, bending for 2-3 weeks at least  - Retinal detachment and endophthalmitis return precautions explained  - no eye rubbing, eye shield at night, no getting eye wet or dirt into eye  - Start drops:              Vigamox QID OS              Prednisolone QID OS              Cyclopentolate QID OS  - Oral Antibiotics: Moxifloxacin 400 mg daily x 1 week    Greta Doom PGY3  Seen with Dr.

## 2018-03-20 NOTE — Telephone Encounter (Signed)
Natalie Webb scheduled her for today

## 2018-03-24 ENCOUNTER — Ambulatory Visit: Payer: Worker's Comp, Other unspecified | Attending: Retina Specialist

## 2018-03-24 ENCOUNTER — Encounter: Payer: Self-pay | Admitting: Student in an Organized Health Care Education/Training Program

## 2018-03-24 DIAGNOSIS — S0532XD Ocular laceration without prolapse or loss of intraocular tissue, left eye, subsequent encounter: Principal | ICD-10-CM | POA: Insufficient documentation

## 2018-03-24 DIAGNOSIS — H40001 Preglaucoma, unspecified, right eye: Secondary | ICD-10-CM | POA: Insufficient documentation

## 2018-03-24 NOTE — Progress Notes (Signed)
Discussed patient with Dr. Raiford Noble and will plan for surgery this week. Used interpreter phone to call patient at home number and cell number [phone] and [phone]. Left messages asking patient to come to clinic to meet with Dr. Randolm Idol on 3/10 at 12:30pm for repeat exam and to discuss plan for surgery.

## 2018-03-24 NOTE — Progress Notes (Signed)
B-scan left eye 03/14/2018: formed round globe with disorganized posterior segment, many vitreous opacities/hemorrhage and likely retinal detachment    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 Erroll Luna)    Today:   [facility], IOP 10, 3+ APD by reverse. Examined patient and reviewed prior B scan from 03/20/18 with Dr. Lavell Islam. 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 Discussed option of conservative management vs surgery with repair of RD/AC washout and patient wishes to pursue all measures that may allow her to regain some vision in this eye. I explained that there are no guarantees of improved vision and patient expressed understanding. Given mild corneal blood staining, surgery indicated within the next week.     Plan:  - will discuss plan for surgery with Drs. Puri and Hadi  - Continue pred forte, vigamox, and cyclo QID   - Continue PO pred 40 mg for pain  - Keep head of bed elevated; avoid blood thinners, excessive straining, heavy lifting, bending for 2-3 weeks at least  - Retinal detachment and endophthalmitis return precautions explained  - no eye rubbing, eye shield at night, no getting eye wet or dirt into eye  - Oral Antibiotics: Moxifloxacin 400 mg daily x 1 week  - Strict return precautions given for any worsening in symptoms, including pain and vision changes    2. 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 Shaaron Adler Spanish [phone]    I reviewed and confirmed all history components, including the HPI; and any other elements performed by the technician. I performed the key and critical portions of the exam. The final examination findings, image interpretations, and plan as documented in the record represent my personal judgement and conclusion.     Dorann Lodge MD.

## 2018-03-25 ENCOUNTER — Ambulatory Visit: Payer: Worker's Comp, Other unspecified

## 2018-03-25 ENCOUNTER — Ambulatory Visit: Payer: Worker's Comp, Other unspecified | Attending: Ophthalmology

## 2018-03-25 DIAGNOSIS — S0532XD Ocular laceration without prolapse or loss of intraocular tissue, left eye, subsequent encounter: Secondary | ICD-10-CM | POA: Insufficient documentation

## 2018-03-25 DIAGNOSIS — H33052 Total retinal detachment, left eye: Secondary | ICD-10-CM | POA: Insufficient documentation

## 2018-03-25 DIAGNOSIS — H40001 Preglaucoma, unspecified, right eye: Secondary | ICD-10-CM | POA: Insufficient documentation

## 2018-03-25 NOTE — Progress Notes (Signed)
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 Erroll Luna)    Today:   [facility], IOP 03, 3+ APD by reverse. Examined patient and reviewed prior B scan from 03/20/18 with Dr. Lavell Islam and reviewed new B-scan today 03/25/18 with funnel RD.    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 Discussed option of conservative management vs surgery with repair of RD/AC washout and patient wishes to pursue all measures that may allow her to regain some vision in this eye. I explained that there are no guarantees of improved vision and patient expressed understanding. Given poor prognosis and high risk of scarring, will attempt to proceed urgently.     Plan:  Risks, benefits and alternatives including no intervention, observation and treatment options were presented and discussed in detail with the patient.   The patient elects to proceed with surgery    Informed consent obtained. Surgery order placed.     - Continue pred forte, vigamox, and cyclo QID   - Continue PO pred 40 mg for pain  - Keep head of bed elevated; avoid blood thinners, excessive straining, heavy lifting, bending for 2-3 weeks at least  - Retinal detachment and endophthalmitis return precautions explained  - no eye rubbing, eye shield at night, no getting eye wet or dirt into eye  - Oral Antibiotics: Moxifloxacin 400 mg daily x 1 week  - Strict return precautions given for any worsening in symptoms, including pain and vision changes    2. 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 I saw and examined the patient and reviewed in detail the findings of the resident/fellow. The final examination findings, image interpretations, and plan as documented in the record represent my personal judgment and conclusions.     Jaeline Whobrey M. Raiford Noble, MD, PhD  Vitreoretinal Surgery Fellow  [facility]

## 2018-03-26 ENCOUNTER — Ambulatory Visit
Admission: RE | Admit: 2018-03-26 | Discharge: 2018-03-26 | Disposition: A | Discharge status: Home / Self Care | Payer: Worker's Comp, Other unspecified | Attending: Retina Specialist | Admitting: Retina Specialist

## 2018-03-26 ENCOUNTER — Ambulatory Visit (HOSPITAL_BASED_OUTPATIENT_CLINIC_OR_DEPARTMENT_OTHER): Payer: Worker's Comp, Other unspecified

## 2018-03-26 ENCOUNTER — Ambulatory Visit: Payer: Worker's Comp, Other unspecified

## 2018-03-26 ENCOUNTER — Encounter: Admission: RE | Disposition: A | Discharge status: Home Health | Payer: Self-pay | Attending: Retina Specialist

## 2018-03-26 DIAGNOSIS — E669 Obesity, unspecified: Secondary | ICD-10-CM | POA: Insufficient documentation

## 2018-03-26 DIAGNOSIS — H2102 Hyphema, left eye: Secondary | ICD-10-CM | POA: Insufficient documentation

## 2018-03-26 DIAGNOSIS — H3322 Serous retinal detachment, left eye: Secondary | ICD-10-CM | POA: Insufficient documentation

## 2018-03-26 DIAGNOSIS — S0532XD Ocular laceration without prolapse or loss of intraocular tissue, left eye, subsequent encounter: Secondary | ICD-10-CM | POA: Insufficient documentation

## 2018-03-26 DIAGNOSIS — H33052 Total retinal detachment, left eye: Secondary | ICD-10-CM | POA: Insufficient documentation

## 2018-03-26 DIAGNOSIS — H31302 Unspecified choroidal hemorrhage, left eye: Secondary | ICD-10-CM | POA: Insufficient documentation

## 2018-03-26 HISTORY — PX: OTHER PROCEDURE: U1053

## 2018-03-26 SURGERY — REPAIR, RETINAL DETACHMENT
Anesthesia: General | Site: Eye | Laterality: Left | Wound class: Class I (Clean)

## 2018-03-26 MED ORDER — ATROPINE SULFATE 1 % OP SOLN
OPHTHALMIC | Status: DC | PRN
Start: 2018-03-26 — End: 2018-03-26
  Administered 2018-03-26: 2 [drp] via OPHTHALMIC

## 2018-03-26 MED ORDER — LEVOFLOXACIN 750 MG OR TABS
750.0000 mg | ORAL_TABLET | Freq: Every day | ORAL | 0 refills | Status: AC
Start: 2018-03-26 — End: 2018-04-05

## 2018-03-26 MED ORDER — BALANCED SALT IO SOLN
INTRAOCULAR | Status: DC | PRN
Start: 2018-03-26 — End: 2018-03-26
  Administered 2018-03-26 (×2): 500 mL

## 2018-03-26 MED ORDER — PROPOFOL 200 MG/20ML IV EMUL
INTRAVENOUS | Status: DC | PRN
Start: 2018-03-26 — End: 2018-03-26
  Administered 2018-03-26: 150 mg via INTRAVENOUS
  Administered 2018-03-26: 20 mg via INTRAVENOUS
  Administered 2018-03-26 (×2): 30 mg via INTRAVENOUS

## 2018-03-26 MED ORDER — OXYCODONE HCL 5 MG OR TABS
5.0000 mg | ORAL_TABLET | ORAL | Status: DC | PRN
Start: 2018-03-26 — End: 2018-03-26
  Administered 2018-03-26: 5 mg via ORAL

## 2018-03-26 MED ORDER — ROCURONIUM BROMIDE 50 MG/5ML IV SOSY
PREFILLED_SYRINGE | INTRAVENOUS | Status: DC | PRN
Start: 2018-03-26 — End: 2018-03-26
  Administered 2018-03-26: 50 mg via INTRAVENOUS

## 2018-03-26 MED ORDER — BSS IO SOLN
INTRAOCULAR | Status: DC | PRN
Start: 2018-03-26 — End: 2018-03-26
  Administered 2018-03-26: 15 mL

## 2018-03-26 MED ORDER — FENTANYL CITRATE (PF) 100 MCG/2ML IJ SOLN
INTRAMUSCULAR | Status: AC
Start: 2018-03-26 — End: 2018-03-26
  Filled 2018-03-26: qty 2

## 2018-03-26 MED ORDER — KETOROLAC TROMETHAMINE 30 MG/ML IJ SOLN
INTRAMUSCULAR | Status: AC
Start: 2018-03-26 — End: 2018-03-26
  Filled 2018-03-26: qty 1

## 2018-03-26 MED ORDER — ACETAMINOPHEN 325 MG PO TABS
650.0000 mg | ORAL_TABLET | ORAL | Status: DC | PRN
Start: 2018-03-26 — End: 2018-03-26
  Administered 2018-03-26: 650 mg via ORAL

## 2018-03-26 MED ORDER — STERILE WATER FOR IRRIGATION IR SOLN
Status: DC | PRN
Start: 2018-03-26 — End: 2018-03-26
  Administered 2018-03-26 (×2): 500 mL

## 2018-03-26 MED ORDER — METHYLPREDNISOLONE SOD SUCCINATE OPHTHALMIC INJ 20 MG/ML (~~LOC~~)
SUBCONJUNCTIVAL | Status: DC | PRN
Start: 2018-03-26 — End: 2018-03-26
  Administered 2018-03-26 (×2): 0.5 mL via SUBCONJUNCTIVAL

## 2018-03-26 MED ORDER — DEXAMETHASONE SODIUM PHOSPHATE 4 MG/ML IJ SOLN (CUSTOM)
INTRAMUSCULAR | Status: AC
Start: 2018-03-26 — End: 2018-03-26
  Filled 2018-03-26: qty 1

## 2018-03-26 MED ORDER — SODIUM CHLORIDE 0.9 % IV SOLN
INTRAVENOUS | Status: DC | PRN
Start: 2018-03-26 — End: 2018-03-26
  Administered 2018-03-26: 09:00:00 via INTRAVENOUS

## 2018-03-26 MED ORDER — ROCURONIUM BROMIDE 50 MG/5ML IV SOLN
INTRAVENOUS | Status: AC
Start: 2018-03-26 — End: 2018-03-26
  Filled 2018-03-26: qty 5

## 2018-03-26 MED ORDER — DIPHENHYDRAMINE HCL 50 MG/ML IJ SOLN
25.0000 mg | Freq: Once | INTRAMUSCULAR | Status: DC | PRN
Start: 2018-03-26 — End: 2018-03-26

## 2018-03-26 MED ORDER — BALANCED SALT IO SOLN
INTRAOCULAR | Status: DC | PRN
Start: 2018-03-26 — End: 2018-03-26
  Administered 2018-03-26 (×2): 500 mL

## 2018-03-26 MED ORDER — SODIUM CHLORIDE 0.9 % IJ SOLN (CUSTOM)
INTRAMUSCULAR | Status: DC | PRN
Start: 2018-03-26 — End: 2018-03-26
  Administered 2018-03-26 (×2): 10 mL via INTRAVENOUS

## 2018-03-26 MED ORDER — ACETAZOLAMIDE SODIUM 500 MG IJ SOLR
500.0000 mg | Freq: Once | INTRAMUSCULAR | Status: AC
Start: 2018-03-26 — End: 2018-03-26
  Administered 2018-03-26 (×2): 500 mg via INTRAVENOUS

## 2018-03-26 MED ORDER — TETRACAINE HCL 0.5 % OP SOLN
1.0000 [drp] | OPHTHALMIC | Status: AC
Start: 2018-03-26 — End: 2018-03-26
  Administered 2018-03-26 (×3): 1 [drp] via OPHTHALMIC

## 2018-03-26 MED ORDER — EPINEPHRINE HCL 1 MG/ML IJ SOLN (CUSTOM)
INTRAMUSCULAR | Status: DC | PRN
Start: 2018-03-26 — End: 2018-03-26
  Administered 2018-03-26 (×2): 0.3 mg

## 2018-03-26 MED ORDER — DEXAMETHASONE SODIUM PHOSPHATE 4 MG/ML IJ SOLN (CUSTOM)
INTRAMUSCULAR | Status: DC | PRN
Start: 2018-03-26 — End: 2018-03-26
  Administered 2018-03-26: 4 mg via INTRAVENOUS

## 2018-03-26 MED ORDER — PHENYLEPHRINE HCL 2.5 % OP SOLN
1.0000 [drp] | OPHTHALMIC | Status: AC
Start: 2018-03-26 — End: 2018-03-26
  Administered 2018-03-26 (×4): 1 [drp] via OPHTHALMIC

## 2018-03-26 MED ORDER — FENTANYL CITRATE (PF) 100 MCG/2ML IJ SOLN
INTRAMUSCULAR | Status: DC | PRN
Start: 2018-03-26 — End: 2018-03-26
  Administered 2018-03-26: 100 ug via INTRAVENOUS

## 2018-03-26 MED ORDER — PHENYLEPHRINE HCL 2.5 % OP SOLN
OPHTHALMIC | Status: AC
Start: 2018-03-26 — End: 2018-03-26
  Filled 2018-03-26: qty 15

## 2018-03-26 MED ORDER — NEOMYCIN-POLYMYXIN-DEXAMETH 3.5-10000-0.1 OP OINT
TOPICAL_OINTMENT | OPHTHALMIC | Status: DC | PRN
Start: 2018-03-26 — End: 2018-03-26
  Administered 2018-03-26 (×2): 1 via OPHTHALMIC

## 2018-03-26 MED ORDER — ONDANSETRON HCL 4 MG/2ML IV SOLN
INTRAMUSCULAR | Status: AC
Start: 2018-03-26 — End: 2018-03-26
  Filled 2018-03-26: qty 2

## 2018-03-26 MED ORDER — ACETAMINOPHEN 325 MG PO TABS
ORAL_TABLET | ORAL | Status: AC
Start: 2018-03-26 — End: 2018-03-26
  Filled 2018-03-26: qty 2

## 2018-03-26 MED ORDER — SODIUM CHLORIDE 0.9 % IV SOLN
500.0000 mL | INTRAVENOUS | Status: DC
Start: 2018-03-26 — End: 2018-03-26
  Administered 2018-03-26 (×2): 500 mL via INTRAVENOUS

## 2018-03-26 MED ORDER — POVIDONE-IODINE 5 % OP SOLN
OPHTHALMIC | Status: DC | PRN
Start: 2018-03-26 — End: 2018-03-26
  Administered 2018-03-26 (×2): 3 [drp] via OPHTHALMIC

## 2018-03-26 MED ORDER — TRIAMCINOLONE ACETONIDE 40 MG/ML IJ SUSP
INTRAMUSCULAR | Status: DC | PRN
Start: 2018-03-26 — End: 2018-03-26
  Administered 2018-03-26 (×2): 40 mg via INTRAOCULAR

## 2018-03-26 MED ORDER — ONDANSETRON HCL 4 MG/2ML IV SOLN
4.0000 mg | Freq: Once | INTRAMUSCULAR | Status: AC | PRN
Start: 2018-03-26 — End: 2018-03-26
  Administered 2018-03-26: 4 mg via INTRAVENOUS

## 2018-03-26 MED ORDER — CYCLOPENTOLATE HCL 1 % OP SOLN
OPHTHALMIC | Status: AC
Start: 2018-03-26 — End: 2018-03-26
  Filled 2018-03-26: qty 2

## 2018-03-26 MED ORDER — ONDANSETRON HCL 4 MG/2ML IV SOLN
INTRAMUSCULAR | Status: DC | PRN
Start: 2018-03-26 — End: 2018-03-26
  Administered 2018-03-26 (×2): 4 mg via INTRAVENOUS

## 2018-03-26 MED ORDER — SODIUM CHLORIDE 0.9 % IV SOLN
INTRAVENOUS | Status: DC
Start: 2018-03-26 — End: 2018-03-26

## 2018-03-26 MED ORDER — LIDOCAINE HCL 1 % IJ SOLN
INTRAMUSCULAR | Status: AC
Start: 2018-03-26 — End: 2018-03-26
  Filled 2018-03-26: qty 20

## 2018-03-26 MED ORDER — SODIUM CHLORIDE 0.9 % IV SOLN
12.5000 mg | Freq: Once | INTRAVENOUS | Status: DC | PRN
Start: 2018-03-26 — End: 2018-03-26

## 2018-03-26 MED ORDER — ACETAZOLAMIDE SODIUM 500 MG IJ SOLR
INTRAMUSCULAR | Status: AC
Start: 2018-03-26 — End: 2018-03-26
  Filled 2018-03-26: qty 500

## 2018-03-26 MED ORDER — TETRACAINE HCL 0.5 % OP SOLN
OPHTHALMIC | Status: AC
Start: 2018-03-26 — End: 2018-03-26
  Filled 2018-03-26: qty 4

## 2018-03-26 MED ORDER — MOXIFLOXACIN HCL 0.5 % OP SOLN
OPHTHALMIC | Status: AC
Start: 2018-03-26 — End: 2018-03-26
  Filled 2018-03-26: qty 3

## 2018-03-26 MED ORDER — FENTANYL CITRATE (PF) 100 MCG/2ML IJ SOLN
25.0000 ug | INTRAMUSCULAR | Status: DC | PRN
Start: 2018-03-26 — End: 2018-03-26
  Administered 2018-03-26 (×5): 25 ug via INTRAVENOUS

## 2018-03-26 MED ORDER — BUPIVACAINE HCL (PF) 0.75 % IJ SOLN
INTRAMUSCULAR | Status: DC | PRN
Start: 2018-03-26 — End: 2018-03-26
  Administered 2018-03-26: 3 mL

## 2018-03-26 MED ORDER — KETOROLAC TROMETHAMINE 30 MG/ML IJ SOLN
30.0000 mg | Freq: Once | INTRAMUSCULAR | Status: AC
Start: 2018-03-26 — End: 2018-03-26
  Administered 2018-03-26 (×2): 30 mg via INTRAVENOUS

## 2018-03-26 MED ORDER — MIDAZOLAM HCL (PF) 2 MG/2ML IJ SOLN
INTRAMUSCULAR | Status: AC
Start: 2018-03-26 — End: 2018-03-26
  Filled 2018-03-26: qty 2

## 2018-03-26 MED ORDER — PROPOFOL 200 MG/20ML IV EMUL
INTRAVENOUS | Status: AC
Start: 2018-03-26 — End: 2018-03-26
  Filled 2018-03-26: qty 20

## 2018-03-26 MED ORDER — LIDOCAINE HCL 20 MG/ML IV INJECTION WRAPPED RECORD
INTRAVENOUS | Status: DC | PRN
Start: 2018-03-26 — End: 2018-03-26
  Administered 2018-03-26 (×2): 50 mg via INTRAVENOUS

## 2018-03-26 MED ORDER — MIDAZOLAM HCL 2 MG/2ML IJ SOLN
INTRAMUSCULAR | Status: DC | PRN
Start: 2018-03-26 — End: 2018-03-26
  Administered 2018-03-26 (×2): 2 mg via INTRAVENOUS

## 2018-03-26 MED ORDER — CYCLOPENTOLATE HCL 1 % OP SOLN
1.0000 [drp] | OPHTHALMIC | Status: AC
Start: 2018-03-26 — End: 2018-03-26
  Administered 2018-03-26 (×4): 1 [drp] via OPHTHALMIC

## 2018-03-26 MED ORDER — PREDNISOLONE ACETATE 1 % OP SUSP
OPHTHALMIC | Status: AC
Start: 2018-03-26 — End: 2018-03-26
  Filled 2018-03-26: qty 5

## 2018-03-26 MED ORDER — PREDNISOLONE ACETATE 1 % OP SUSP
1.0000 [drp] | OPHTHALMIC | Status: AC
Start: 2018-03-26 — End: 2018-03-26
  Administered 2018-03-26 (×4): 1 [drp] via OPHTHALMIC

## 2018-03-26 MED ORDER — MOXIFLOXACIN HCL 0.5 % OP SOLN
1.0000 [drp] | OPHTHALMIC | Status: AC
Start: 2018-03-26 — End: 2018-03-26
  Administered 2018-03-26 (×4): 1 [drp] via OPHTHALMIC

## 2018-03-26 MED ORDER — NALOXONE HCL 0.4 MG/ML IJ SOLN
0.2000 mg | Freq: Once | INTRAMUSCULAR | Status: DC | PRN
Start: 2018-03-26 — End: 2018-03-26

## 2018-03-26 MED ORDER — CEFAZOLIN OPHTHALMIC INJ 50 MG/ML (~~LOC~~)
SUBCONJUNCTIVAL | Status: DC | PRN
Start: 2018-03-26 — End: 2018-03-26
  Administered 2018-03-26 (×2): 0.5 mL via SUBCONJUNCTIVAL

## 2018-03-26 MED ORDER — OXYCODONE HCL 5 MG OR TABS
ORAL_TABLET | ORAL | Status: AC
Start: 2018-03-26 — End: 2018-03-26
  Filled 2018-03-26: qty 1

## 2018-03-26 SURGICAL SUPPLY — 31 items
CANISTER SUCTION POROUS 2000 CC RIGID LOCK LID STANDARD ELBOW PORT SHUTOFF VALVE LATEX FREE DISPOSAB (Misc Medical Supply) ×2 IMPLANT
CATH IV 18GX1 1/4 FEP (Procedural wires/sheaths/catheters/balloons/dilators) IMPLANT
CHANDELIER 25 G RIFID ALCON (Disp Instruments) IMPLANT
CONSTELLATION AUTO GAS FILL PAK (Procedure Packs/kits) IMPLANT
ERASER CAUT BP WETFIELD 18 GA STER DISP (Laser Fibers/Probes) IMPLANT
FILTER SYR .22 M 25 MM 100 ML VOLUME (Misc Surgical Supply) IMPLANT
FORCEPS END-GRASPING OPHT 23 GA STER DISP (Disp Instruments) IMPLANT
FORCEPS SERRATED OPHTH TIP 23 GA (Disp Instruments) ×2 IMPLANT
GLOVE LIGHT HANDLE COVER 2/PK (Drapes/towels) IMPLANT
HANDLE MED/STANDARD DISP (Misc Surgical Supply) ×2 IMPLANT
KNIFE MVR STR 20 GA STER DISP (Knives/Blades) IMPLANT
NEEDLE OPHTH 25 GA SOFT TIP DISP (Needles/punch/cannula/biopsy) ×2 IMPLANT
OIL SILC ADATOSIL 5000 SYR ES5000S (Eye implants- synthetic) ×2 IMPLANT
PACK FRAG CONSTELLATION (Procedure Packs/kits) IMPLANT
PACK TOTAL PLUS 23G VITRECTOMY BUNDLE (Misc Surgical Supply) ×2 IMPLANT
PACK TOTAL PLUS 23G VITRECTOMY W/CATARACT (Misc Surgical Supply) IMPLANT
PACK VFC CONSTELLATION (Procedure Packs/kits) ×2 IMPLANT
PFO 5ML LIQUID PERFLUORCARBON 5 ML KIT PERFLUORON (Uncategorized Implant) ×2 IMPLANT
PFO 7ML LIQUID PERFLURCARBON 7 ML KIT PERFLURON C3F8 (Kits/Sets/Trays) IMPLANT
PROBE LASER 23G STR RFID ALCON SURG (Laser Fibers/Probes) IMPLANT
PROBE LASER 23GA ILLUMINATED CURVED ALCON SURG (Laser Fibers/Probes) ×2 IMPLANT
PROBE LASER 25G STR RFID ALCON SURG (Laser Fibers/Probes) IMPLANT
SCRAPER MEMBRANE DMND DUST 25 GA (Disp Instruments) IMPLANT
SHIELD EYE PLAST CLR UNIV 0008618 (Misc Surgical Supply) ×2 IMPLANT
STERILE WATER IRRIG 500 ML BTL (Non-Pharmacy Meds/Solutions) ×2 IMPLANT
STERISTRIPS1/2"X4"6E4547 (Misc Medical Supply) ×2 IMPLANT
SYRINGE 60ML LUER LOCK TIP (Misc Medical Supply) IMPLANT
Scissors Ophthalmic 23G Curved Grieshaber Evolution Disposable (Lap/Endo/Arthroscopy) IMPLANT
TIRE CONVEX STYLE 287 SCLERAL BUCKLING IMPLANT
TOWELS, STERILE BLUE (Drapes/towels) ×2 IMPLANT
TROCAR 6MM (Laser Fibers/Probes) IMPLANT

## 2018-03-26 NOTE — Anesthesia Preprocedure Evaluation (Addendum)
ANESTHESIA PRE-OPERATIVE EVALUATION    Patient Information    Name: Natalie Webb    MRN: [id number]    DOB: 1975/03/10    Age: 43 year old    Sex: female  Procedure(s):  PARS PLONA VITRECTOMY, ENDOLASER, POSSIBLE NURBREA PEAL, GAS OR OIL OR BUDLE LEFT EYE      Pre-op Vitals:   LMP 03/13/2018         Primary language spoken:  Spanish    ROS/Medical History:       History of Present Illness: 43 yo F with left ruptured globe 03/13/18 from work related injury, poor visual prognosis per optho note 3/10    Temperature: 36.1 C (03/11 0850)  Blood pressure (BP): 182/93 (03/11 0850)  Heart Rate: 69 (03/11 0850)  Respirations: 16 (03/11 0850)  SpO2: 100 % (03/11 0850)          General:  positive for Obesity,  #Social  -Nonsmoker  -No alcohol  -No drugs   Cardiovascular:  negative cardio ROS  EKG (03/13/18): NSR, 63 bpm    No formal diagnosis of HTN but pt says her BP is elevated at hospital b/c of pain.   Anesthesia History:  no history of anesthetic complications,  no family history of anesthetic complications,  #PShx  -C/s  03/13/18 Initial repair of ruptured globe as emergency. RSI glide, grade 1, 6.5 Pulmonary:   negative pulmonary ROS     Neuro/Psych:   #Rupture, left globe    -[facility] 2/27: Gross deformity of the left globe, suspicious for globe rupture with associated hemorrhage. There is surrounding extraconal hematoma and soft tissue stranding. No evidence of orbital fracture. No radiodense foreign body is identified.    No acute or focal significant intracranial abnormality. Hematology/Oncology:       GI/Hepatic:  GERD (occasionally takes OTC meds ),   Infectious Disease:  Open wound comment: L globe.     Renal:  negative renal ROS   Endocrine/Other:  negative endo/other ROS     Pregnancy History:  Currently pregnant: no,  Urine hcg neg DOS Pediatrics:    negative pediatric ROS       Pre Anesthesia Testing (PCC/CPC) notes/comments:                          Last minute add on yesterday after preop clinic closed.  Repeat  procedure.  EKG ok.  No new information from AQ.  Did fine last procedure.  Ready Kallie Locks, 03/26/2018 7:23 AM                 Physical Exam    Airway:  Inter-inciser distance > 4 cm    Mallampati: III  Neck ROM: full  TM distance: > 6 cm  Short thick neck: No        Cardiovascular:  - cardiovascular exam normal         Pulmonary:      Positive for decreased breath sounds       Neuro/Neck/Skeletal/Skin:      Dental:    Comment: Intact per pt.    Abdominal:              Last  OSA (STOP BANG) Score:  No data recorded    Last OSA  (STOP) Score for   No data recorded                 No past medical history on file.  No past surgical  history on file.  Social History     Tobacco Use   . Smoking status: Never Smoker   . Smokeless tobacco: Never Used   Substance Use Topics   . Alcohol use: Never     Frequency: Never   . Drug use: Not on file       No current facility-administered medications for this encounter.      Current Outpatient Medications   Medication Sig Dispense Refill   . cyclopentolate (CYCLODRYL) 1 % ophthalmic solution Place 1 drop into left eye 4 times daily. 1 bottle 0   . moxifloxacin (VIGAMOX) 0.5 % ophthalmic solution Place 1 drop into left eye 4 times daily. 1 bottle 0   . prednisoLONE acetate (PRED FORTE) 1 % ophthalmic suspension Place 1 drop into left eye 4 times daily. 1 bottle 0   . predniSONE (DELTASONE) 10 MG tablet Take 2 tablets (20 mg) by mouth daily for 10 days. 20 tablet 0     No Known Allergies    Labs and Other Data  Lab Results   Component Value Date    SODIUM 134 (L) 03/13/2018    K 4.1 03/13/2018    CL 102 03/13/2018    BUN 12 03/13/2018    CREAT 0.7 03/13/2018    GLU 97 03/13/2018    [place] 11.4 (H) 03/13/2018     No results found for: AST, ALT, GGT, LDH, ALK, TP, ALB, TBILI, DBILI  Lab Results   Component Value Date    RBC 4.44 03/13/2018    HGB 11.9 03/13/2018    HCT 36.0 03/13/2018    MCV 81.2 (L) 03/13/2018    MCHC 33.2 03/13/2018    RDW 15.3 (H) 03/13/2018    PLT 424 (H) 03/13/2018     MPVH 7.4 03/13/2018     Lab Results   Component Value Date    INR 1.02 03/13/2018    PTT 27.9 03/13/2018     No results found for: ARTPH, ARTPO2, ARTPCO2    Anesthesia Plan:  Risks and Benefits of Anesthesia  I personally examined the patient immediately prior to the anesthetic and reviewed the pertinent medical history, drug and allergy history, laboratory and imaging studies and consultations. I have determined that the patient has had adequate assessment and testing.    Anesthetic techniques, invasive monitors, anesthetic drugs for induction, maintenance and post-operative analgesia, risks and alternatives have been explained to the patient and/or patient's representatives.    I have prescribed the anesthetic plan:         Planned anesthesia method: General         ASA 2 (Mild systemic disease)     Potential anesthesia problems identified and risks including but not limited to the following were discussed with patient and/or patient's representative: Adverse or allergic drug reaction, Recall, Ocular injury, Dental injury or sore throat, Nerve injury and Injury to brain, heart and other organs    No Beta Blocker Indicated: Patient not on beta blockersPlanned monitoring method: Routine monitoring  Comments: (Spanish interpreter#[id number]  NPO>MN.  Took PO antibiotic in preop with sip of water this AM.  Plan GA, std ASA monitors.  R/b/o d/w pt, questions/concerns addressed prior to consent.)    Informed Consent:  Interpreter used: Over phone  Anesthetic plan and risks discussed with Patient.

## 2018-03-26 NOTE — Anesthesia Postprocedure Evaluation (Signed)
Anesthesia Post Note    Patient: Natalie Webb    Procedure(s) Performed: Procedure(s):  PARS PLANA  VITRECTOMY, ENDOLASER, membrane peel, Silicone OIL,retinectomy LEFT EYE      Final anesthesia type: General    Patient location: PACU    Post anesthesia pain: adequate analgesia    Mental status: awake, alert  and oriented    Airway Patent: Yes    Last Vitals:    Vitals Value Taken Time   BP 170/95 03/26/2018  2:00 PM   Temp 36 C 03/26/2018  1:10 PM   Pulse 71 03/26/2018  2:00 PM   Resp 16 03/26/2018  2:00 PM   SpO2 95 % 03/26/2018  2:00 PM        Temperature >35.5: Yes    Post vital signs: stable at patient's baseline    Hydration: adequate    N/V:no    Plan of care per primary team.

## 2018-03-26 NOTE — H&P (Signed)
HISTORY & PHYSICAL - INTERVAL ASSESSMENT & UPDATE     Natalie Webb        MRN: [id number]        I have reviewed the current history and physical (which is less than 2 days old) and verified its content. I have reexamined the patient today. There are no significant changes to medical status, history or physical examinations in the 24 hours prior to the procedure.  Pre-operative antibiotics are not indicated. Patient is undergoing a procedure in which antibiotics are not indicated.    Molli Hazard, MD       03/26/18  8:49 AM

## 2018-03-26 NOTE — Op Note (Addendum)
PATIENT NAME: Natalie Webb, Natalie Webb       MEDICAL RECORD NUMBER: [id number]     DATE OF SERVICE: 03/26/2018     SURGEON(S): Dorann Lodge, MD (A)     ASSISTANT(S): Susa Day, MD PhD (F); Molli Hazard, MD (F)     PRE OPERATIVE DIAGNOSIS:  Ruptured globe, left eye  Retinal detachment, left eye  Choroidal hemorrhage, left eye  Hyphema, vitreous hemorrhage, subretinal hemorrhage and fibrosis, left eye     POST OPERATIVE DIAGNOSIS:  same     PROCEDURES PERFORMED:   Pars plana vitrectomy, membrane peel, endolaser, air-fluid exchange, silicone oil injection, left eye     COMPLICATIONS:  None      ANESTHESIA: General anesthesia     IMPLANTS: None     ESTIMATED BLOOD LOSS: Minimal     SPECIMENS: None     INDICATIONS FOR PROCEDURE:  The patient is a 43 year old female who presented to clinic after ruptured globe repair of the left eye following a traumatic incident. She was noted to have hyphema, vitreous hemorrhage, choroidal detachment and retinal detachment of the left eye with limited vision. Extensive discussion about the risks, benefits, alternatives, and overall poor prognosis for surgical repair of the eye with the patient and her family. Ultimately, the patient consented to the procedure and was scheduled for surgery.      DESCRIPTION OF PROCEDURE:  The patient was greeted in the preoperative holding area where all questions regarding the procedure were answered and informed consent was obtained. The patient was brought back to the operative suite where general anesthesia was induced.  A surgical time out was performed and all in the room confirmed that the left eye was the correct surgical eye. The eye was prepped and draped in the usual sterile fashion for major intraocular surgery. A lid speculum was placed.      A 23 gauge vitrectomy cannula was placed into the anterior chamber in the inferotemporal quadrant 1 mm posterior to the limbus. An infusion line was placed inside this cannula, visualized inside the eye, then  switched on.     Additional cannulas were placed in the superonasal and superotemporal quadrants 3 mm posterior to the limbus.     The vitrector was used to perform careful anterior vitrectomy removing hemorrhage, remaining capsular elements.      The Resight viewing system was brought into position over the eye. The now unicameral eye was carefully cleared of central fibrotic clot anteriorly to posteriorly, ultimately revealing and detached retina and underlying subretinal hemorrhage. Three areas of anterior retinal incarceration were noted superiorly, nasally and temporally.      Endocautery was to demark the retina in the temporal 180 degrees and the peripheral retina was cut initially nasally and ultimately 360. Subretinal hemorrhage was vitrectomized, flushed and aspirated. Perfluoron heavy liquid was injected into the eye and removed to delineate membranes. Pre and subretinal membranes were carefully peeled using 23 gauge serrated forceps. Choroidal hemorrhage was partially drained externally on the temporal side. An inferior peripheral iridotomy was constructed using the vitrector.     Perfluoron heavy liquid was once again used to flatten the retina, which was then barricaded at the periphery using 360 endolaser. Air-fluid exchange was performed, removing PFO and overlying fluid, ultimately drying the eye down to the level of the optic nerve.     Silicone oil was injected into the eye to perform a complete fill to the level of the iris.     Each of the  cannulas were removed and the sclerotomies and conjunctival peritomies sutured closed with 7-0 vicryl stitches. Dilute betadine was irrigated over the eye and washed away with balanced saline solution. Subconjunctival ancef and solumedrol were injected. Subtenon's kenalog and marcaine were injected. The lid speculum and drapes were removed. Maxitrol ointment was applied to the eye. The eye was then patched and shielded. The patient was awoken from general  anesthesia without complications. The patient was taken to the recovery room in stable condition. Due to the complexity of the case, a qualified resident was unable to assist.

## 2018-03-26 NOTE — Anesthesia Procedure Notes (Signed)
Intubation  Performed by: Darrold Junker, MD  Authorized by: Darrold Junker, MD     Procedure Details:     Pre-oxygenated: Yes      Induction:  IV    Mask Ventilation:  Easy    Blade:  Mac 4    View Grade:  2    Tube type:  Oral cuffed    Tube size (mm):  7.0    Tube Secured at (cm):  21    Cricoid pressure: No      Stylet: Yes      Bougie Used: No      Dental damage: No      Lip/Other damage: No      Bite block inserted:  Gauze    ET-CO2 present: Yes      Breath sounds equal: Yes      Eyes taped: Yes       PreO2. Smooth IV induction. Easy BMV. Eyes taped closed.  DLX1 MAC4 grade 2 view, atraumatic intub +EtCO2, BBSE, ETT secured. esophag temp probe, soft bite block placed. Bed turned 90.  Eye tape removed by surg.

## 2018-03-26 NOTE — Discharge Instructions (Signed)
General Anesthesia, Adult, Care After  This sheet gives you information about how to care for yourself after your procedure. Your health care provider may also give you more specific instructions. If you have problems or questions, contact your health care provider.  What can I expect after the procedure?  After the procedure, the following side effects are common:   Pain or discomfort at the IV site.   Nausea.   Vomiting.   Sore throat.   Trouble concentrating.   Feeling cold or chills.   Weak or tired.   Sleepiness and fatigue.   Soreness and body aches. These side effects can affect parts of the body that were not involved in surgery.  Follow these instructions at home:    For at least 24 hours after the procedure:   Have a responsible adult stay with you. It is important to have someone help care for you until you are awake and alert.   Rest as needed.   Do not:  ? Participate in activities in which you could fall or become injured.  ? Drive.  ? Use heavy machinery.  ? Drink alcohol.  ? Take sleeping pills or medicines that cause drowsiness.  ? Make important decisions or sign legal documents.  ? Take care of children on your own.  Eating and drinking   Follow any instructions from your health care provider about eating or drinking restrictions.   When you feel hungry, start by eating small amounts of foods that are soft and easy to digest (bland), such as toast. Gradually return to your regular diet.   Drink enough fluid to keep your urine pale yellow.   If you vomit, rehydrate by drinking water, juice, or clear broth.  General instructions   If you have sleep apnea, surgery and certain medicines can increase your risk for breathing problems. Follow instructions from your health care provider about wearing your sleep device:  ? Anytime you are sleeping, including during daytime naps.  ? While taking prescription pain medicines, sleeping medicines, or medicines that make you drowsy.   Return to  your normal activities as told by your health care provider. Ask your health care provider what activities are safe for you.   Take over-the-counter and prescription medicines only as told by your health care provider.   If you smoke, do not smoke without supervision.   Keep all follow-up visits as told by your health care provider. This is important.  Contact a health care provider if:   You have nausea or vomiting that does not get better with medicine.   You cannot eat or drink without vomiting.   You have pain that does not get better with medicine.   You are unable to pass urine.   You develop a skin rash.   You have a fever.   You have redness around your IV site that gets worse.  Get help right away if:   You have difficulty breathing.   You have chest pain.   You have blood in your urine or stool, or you vomit blood.  Summary   After the procedure, it is common to have a sore throat or nausea. It is also common to feel tired.   Have a responsible adult stay with you for the first 24 hours after general anesthesia. It is important to have someone help care for you until you are awake and alert.   When you feel hungry, start by eating small amounts of foods that   are soft and easy to digest (bland), such as toast. Gradually return to your regular diet.   Drink enough fluid to keep your urine pale yellow.   Return to your normal activities as told by your health care provider. Ask your health care provider what activities are safe for you.  This information is not intended to replace advice given to you by your health care provider. Make sure you discuss any questions you have with your health care provider.  Document Released: 04/09/2000 Document Revised: 08/17/2016 Document Reviewed: 08/17/2016  Elsevier Interactive Patient Education  2019 Elsevier Inc.  ...    Please keep your patch and shield on until seen by your doctor tomorrow at:    2nd floor, [facility]  [address]  [place], [place]    Please bring your drops with you.

## 2018-03-26 NOTE — Addendum Note (Signed)
Addendum  created 03/26/18 1425 by Lily Peer, MD    Order list changed

## 2018-03-27 ENCOUNTER — Ambulatory Visit: Payer: Worker's Comp, Other unspecified | Attending: Ophthalmology | Admitting: Ophthalmology

## 2018-03-27 DIAGNOSIS — S0532XA Ocular laceration without prolapse or loss of intraocular tissue, left eye, initial encounter: Secondary | ICD-10-CM | POA: Insufficient documentation

## 2018-03-27 DIAGNOSIS — H33052 Total retinal detachment, left eye: Secondary | ICD-10-CM | POA: Insufficient documentation

## 2018-03-27 MED ORDER — ERYTHROMYCIN 5 MG/GM OP OINT
1.0000 | TOPICAL_OINTMENT | Freq: Once | OPHTHALMIC | Status: DC
Start: ? — End: 2018-05-22

## 2018-03-27 MED ORDER — PREDNISONE 10 MG OR TABS
20.0000 mg | ORAL_TABLET | Freq: Three times a day (TID) | ORAL | 0 refills | Status: AC
Start: 2018-03-27 — End: 2018-04-11

## 2018-03-27 NOTE — Progress Notes (Signed)
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 Erroll Luna)  Pre-op  VA LP, IOP 03, 3+ APD by reverse. Examined patient and reviewed prior B scan from 03/20/18 with Dr. Lavell Islam and reviewed new B-scan today 03/25/18 with funnel RD.    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 Discussed option of conservative management vs surgery with repair of RD/AC washout and patient wishes to pursue all measures that may allow her to regain some vision in this eye. I explained that there are no guarantees of improved vision and patient expressed understanding. Given poor prognosis and high risk of scarring, will attempt to proceed urgently.     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 Raiford Noble and Dr. Lavell Islam      2. 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 I saw and examined the patient and reviewed in detail the findings of the resident/fellow. The final examination findings, image interpretations, and plan as documented in the record represent my personal judgment and conclusions.     Molli Hazard, MD  Vitreoretinal Surgery Fellow  [facility]

## 2018-03-31 ENCOUNTER — Ambulatory Visit: Payer: Worker's Comp, Other unspecified | Attending: Ophthalmology | Admitting: Ophthalmology

## 2018-03-31 DIAGNOSIS — Z09 Encounter for follow-up examination after completed treatment for conditions other than malignant neoplasm: Secondary | ICD-10-CM | POA: Insufficient documentation

## 2018-03-31 DIAGNOSIS — H33052 Total retinal detachment, left eye: Secondary | ICD-10-CM | POA: Insufficient documentation

## 2018-04-03 NOTE — Progress Notes (Signed)
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 Erroll Luna)  Pre-op  VA LP, IOP 03, 3+ APD by reverse. Examined patient and reviewed prior B scan from 03/20/18 with Dr. Lavell Islam and reviewed new B-scan today 03/25/18 with funnel RD.    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 I saw and examined the patient and reviewed in detail the findings of the resident/fellow. The final examination findings, image interpretations, and plan as documented in the record represent my personal judgment and conclusions.     Alisabeth Selkirk M. Raiford Noble, MD, PhD  Vitreoretinal Surgery Fellow  [facility]

## 2018-04-11 ENCOUNTER — Ambulatory Visit: Payer: Worker's Comp, Other unspecified | Attending: Retina Specialist

## 2018-04-11 DIAGNOSIS — S0532XD Ocular laceration without prolapse or loss of intraocular tissue, left eye, subsequent encounter: Secondary | ICD-10-CM | POA: Insufficient documentation

## 2018-04-11 MED ORDER — CYCLOPENTOLATE HCL 1 % OP SOLN
1.0000 [drp] | Freq: Four times a day (QID) | OPHTHALMIC | 0 refills | Status: DC
Start: 2018-04-11 — End: 2018-05-22

## 2018-04-11 NOTE — Progress Notes (Signed)
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 Erroll Luna)  Pre-op  VA LP, IOP 03, 3+ APD by reverse. Examined patient and reviewed prior B scan from 03/20/18 with Dr. Lavell Islam and reviewed new B-scan today 03/25/18 with funnel RD.    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     Today: VA HM, IOP 13,  Retina flat at edges of retinectomy, no folds notes, choroidals slowly resolving    Instructions reviewed   - RD precautions  - Return for increasing pain/decreasing vision  - No getting the eye wet, no rubbing the eye, no heavy lifting for 1 month after surgery  Drops:  Vigamox - stop  Pred - QID (start 03/27/18)  Cyc - QID - refilled today  Maxitrol - QHS  Prednisone PO - start taper: 20 mg daily x 1 week then 10 mg daily x 1 week, then stop (started on 40 mg 03/20/18, increased to 60 mg 3/12 post-op but per patient she has only been taking 30 mg daily)  Strict return precautions for worsening pain or decline in vision    2. 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 Raiford Noble and Lavell Islam    I reviewed and confirmed all history components, including the HPI; and any other elements performed by the technician. I performed the key and critical portions of the exam. The final examination findings,  image interpretations, and plan as documented in the record represent my personal judgement and conclusion.     Dorann Lodge MD.

## 2018-04-15 ENCOUNTER — Telehealth: Payer: Self-pay

## 2018-04-15 NOTE — Telephone Encounter (Signed)
Natalie Webb from [employer] [facility] [employer] is  Requesting work status

## 2018-04-16 NOTE — Telephone Encounter (Signed)
LM to clarify what it is that is requested by Sherilyn Cooter.

## 2018-04-17 ENCOUNTER — Ambulatory Visit: Payer: Worker's Comp, Other unspecified

## 2018-04-17 ENCOUNTER — Telehealth: Payer: Self-pay

## 2018-04-17 NOTE — Telephone Encounter (Signed)
Dr. Nathaneil Canary was calling about doing a peer to peer. Md was transferred to clinic.

## 2018-04-18 ENCOUNTER — Telehealth: Payer: Self-pay

## 2018-04-18 ENCOUNTER — Encounter: Payer: Self-pay | Admitting: Ophthalmology

## 2018-04-18 NOTE — Telephone Encounter (Signed)
Molli Hazard, MD  You; Nicholaus Bloom, MD Just now (10:54 AM)      This is fine, she was seen 3/27 and needed 4 week f/u only.

## 2018-04-18 NOTE — Telephone Encounter (Signed)
Patient missed appointment yesterday with retina clinic post op  ; she also have appointment 05/15/2018 can she wait until then? Please advise!

## 2018-04-18 NOTE — Telephone Encounter (Signed)
dr. Nathaneil Canary [phone]

## 2018-04-18 NOTE — Telephone Encounter (Signed)
Molli Hazard, MD  Sula Rumple; Raiford Noble, Lorie Phenix, MD 35 minutes ago (8:15 AM)      Letter written

## 2018-04-18 NOTE — Telephone Encounter (Signed)
Transferred call to [facility] clinic. Sherilyn Cooter returning [place]  call. Spoke with Barnie Alderman at clinic. Thanks.

## 2018-04-18 NOTE — Telephone Encounter (Signed)
CALLED CANNOT LVM TO CONFIRM APPOINTMENT

## 2018-04-18 NOTE — Telephone Encounter (Signed)
SPOKE TO Lonell Face NUMBER [phone] - FAXED NOTE TODAY

## 2018-04-18 NOTE — Telephone Encounter (Signed)
Molli Hazard, MD  You; Nicholaus Bloom, MD 8 minutes ago (10:54 AM)      Called and left message for Dr. Garret Reddish for peer to peer callback.Marland KitchenMarland Kitchen

## 2018-04-18 NOTE — Telephone Encounter (Signed)
SPOKE TO [facility] DUPLICATE MESSAGE

## 2018-04-18 NOTE — Telephone Encounter (Signed)
LVM FOR HENRY WE NEED HE'S FAX NUMBER TO FAX OVER THE WORK STATUS LETTER

## 2018-04-21 ENCOUNTER — Ambulatory Visit: Payer: Worker's Comp, Other unspecified | Attending: Ophthalmology | Admitting: Ophthalmology

## 2018-04-21 DIAGNOSIS — H33052 Total retinal detachment, left eye: Secondary | ICD-10-CM | POA: Insufficient documentation

## 2018-04-21 DIAGNOSIS — S0532XD Ocular laceration without prolapse or loss of intraocular tissue, left eye, subsequent encounter: Secondary | ICD-10-CM | POA: Insufficient documentation

## 2018-04-21 MED ORDER — PREDNISONE 10 MG OR TABS
10.0000 mg | ORAL_TABLET | Freq: Every day | ORAL | Status: DC
Start: ? — End: 2018-05-22

## 2018-04-21 NOTE — Progress Notes (Signed)
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 Erroll Luna)  Pre-op  VA LP, IOP 03, 3+ APD by reverse. Examined patient and reviewed prior B scan from 03/20/18 with Dr. Lavell Islam and reviewed new B-scan today 03/25/18 with funnel RD.    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 Keep oil 6 mo    Instructions reviewed   - RD precautions  - Return for increasing pain/decreasing vision  - No getting the eye wet, no rubbing the eye, no heavy lifting for 1 month after surgery  Drops:  Vigamox - stop  Pred - QID (start 03/27/18)  Cyc - QID - refilled today  Maxitrol - QHS  Prednisone PO - taper: continue weekly 10 mg taper. Continue 10 mg daily for 1 week, then every other day, then stop    Strict return precautions for worsening pain or decline in vision    2. 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 Granville Hamilton    -  I reviewed and confirmed the techs ROS, past histories, and readings. I saw and examined the patient and reviewed in detail the findings of the resident/fellow. The final examination findings, image interpretations, and plan as documented in the record represent my personal  judgment and conclusions.     Molli Hazard, MD  Vitreoretinal Surgery Fellow  [facility]

## 2018-05-09 ENCOUNTER — Telehealth: Payer: Self-pay

## 2018-05-09 NOTE — Telephone Encounter (Signed)
Shawna from pt's employer is calling to request a work status report for this pt.   Pls fax it to her: Attention: Shawna at   fax # [phone]  And if any questions pls call her at [phone]. Thanks.

## 2018-05-12 ENCOUNTER — Telehealth: Payer: Self-pay

## 2018-05-12 NOTE — Telephone Encounter (Signed)
*  Spanish Speaking*Pt calling, states is out of eye drops Cyclodmyl. Wants to know if she is supposed to continue taking?  If so she needs med to be refilled. Tried calling office for tech, informed they were in meeting to please take message. Pt will call back to follow up because she did not know phone number.  Thank you.

## 2018-05-12 NOTE — Telephone Encounter (Signed)
Patient called to update phone number

## 2018-05-13 NOTE — Telephone Encounter (Signed)
Molli Hazard, MD  You 30 minutes ago (2:48 PM)      She doesn't need to use cyclo anymore

## 2018-05-13 NOTE — Telephone Encounter (Signed)
SPOKE TO PATIENT TO LET HER KNOW

## 2018-05-13 NOTE — Telephone Encounter (Signed)
FAXED LETTER TO [phone] IF THEY NEED ANYTHING ELSE THEY COULD CALL us.    Molli Hazard, MD  You; Nicholaus Bloom, MD 1 hour ago (11:33 AM)      I wrote a letter on 04/18/18. What else do they want written in it?

## 2018-05-13 NOTE — Telephone Encounter (Signed)
Drops:  Vigamox - stop  Pred - QID (start 03/27/18)  Cyc - QID - refilled today  Maxitrol - QHS  Prednisone PO - taper: continue weekly 10 mg taper. Continue 10 mg daily for 1 week, then every other day, then stop

## 2018-05-15 ENCOUNTER — Ambulatory Visit: Payer: Worker's Comp, Other unspecified

## 2018-05-21 ENCOUNTER — Ambulatory Visit: Payer: Worker's Comp, Other unspecified

## 2018-05-22 ENCOUNTER — Ambulatory Visit: Payer: Worker's Comp, Other unspecified | Attending: Ophthalmology

## 2018-05-22 DIAGNOSIS — S0532XD Ocular laceration without prolapse or loss of intraocular tissue, left eye, subsequent encounter: Secondary | ICD-10-CM | POA: Insufficient documentation

## 2018-05-22 DIAGNOSIS — H33052 Total retinal detachment, left eye: Secondary | ICD-10-CM

## 2018-05-22 MED ORDER — PREDNISOLONE ACETATE 1 % OP SUSP
1.0000 [drp] | Freq: Every day | OPHTHALMIC | 0 refills | Status: DC
Start: 2018-05-22 — End: 2018-06-06

## 2018-05-22 NOTE — Progress Notes (Signed)
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 Erroll Luna)  Pre-op  VA LP, IOP 03, 3+ APD by reverse. Examined patient and reviewed prior B scan from 03/20/18 with Dr. Lavell Islam and reviewed new B-scan today 03/25/18 with funnel RD.    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 epithelial downgrowth. Keep oil 6 mo    Instructions reviewed   - RD precautions  - Return for increasing pain/decreasing vision  - No getting the eye wet, no rubbing the eye, no heavy lifting for 1 month after surgery  Drops:  Vigamox - stop  Pred - QID (start 03/27/18)  Cyc - QID - may stop  Maxitrol - QHS  Prednisone PO - completed taper    Strict return precautions for worsening pain or decline in vision    2. 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 I saw and examined the patient and reviewed in detail the findings of the resident/fellow. The final examination findings, image interpretations, and plan as documented in the record represent my personal judgment and conclusions.     Shea Swalley  M. Raiford Noble, MD, PhD  Vitreoretinal Surgery Fellow  [facility]    [employer] Interpreter [phone]

## 2018-05-27 ENCOUNTER — Telehealth: Payer: Self-pay

## 2018-05-27 NOTE — Telephone Encounter (Signed)
Patient  states her vision its getting worse daily unable to see out of left eye.      Iphone message left

## 2018-05-28 NOTE — Telephone Encounter (Signed)
Spoke to patient she's c/o decrease vision

## 2018-05-29 ENCOUNTER — Ambulatory Visit: Payer: Self-pay

## 2018-05-29 ENCOUNTER — Ambulatory Visit: Payer: Worker's Comp, Other unspecified | Attending: Ophthalmology | Admitting: Ophthalmology

## 2018-05-29 DIAGNOSIS — H5462 Unqualified visual loss, left eye, normal vision right eye: Secondary | ICD-10-CM | POA: Insufficient documentation

## 2018-05-29 NOTE — Progress Notes (Signed)
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 Erroll Luna)  Pre-op  VA LP, IOP 03, 3+ APD by reverse. Examined patient and reviewed prior B scan from 03/20/18 with Dr. Lavell Islam and reviewed new B-scan today 03/25/18 with funnel RD.  03/26/18: s/p PPV/MP/Retinectomy/EL/AfX/SO & Choroidal Drainage OS (Date 03/26/18)    Today: presents as triage for woresning vision and pain. Feel pain is 2/2 sutures and decreased vision is from epithelial downgrowth. Retina is stable      Plan  Keep 5/22 visit to see retina and cornea because will likely need combo case given epithelial downgrowth  Keep oil 6 months  Continue PF schedule QID  Continue cyclo QID  Recommend lacrilube QID more prn    2. 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 Randolm Idol

## 2018-06-06 ENCOUNTER — Ambulatory Visit: Payer: Worker's Comp, Other unspecified | Attending: Retina Specialist

## 2018-06-06 ENCOUNTER — Ambulatory Visit: Payer: Worker's Comp, Other unspecified

## 2018-06-06 DIAGNOSIS — Z09 Encounter for follow-up examination after completed treatment for conditions other than malignant neoplasm: Secondary | ICD-10-CM

## 2018-06-06 DIAGNOSIS — H33052 Total retinal detachment, left eye: Secondary | ICD-10-CM | POA: Insufficient documentation

## 2018-06-06 DIAGNOSIS — S0532XD Ocular laceration without prolapse or loss of intraocular tissue, left eye, subsequent encounter: Secondary | ICD-10-CM

## 2018-06-06 DIAGNOSIS — H538 Other visual disturbances: Secondary | ICD-10-CM | POA: Insufficient documentation

## 2018-06-06 MED ORDER — PREDNISOLONE ACETATE 1 % OP SUSP
1.0000 [drp] | Freq: Four times a day (QID) | OPHTHALMIC | 0 refills | Status: DC
Start: 2018-06-06 — End: 2018-07-03

## 2018-06-06 NOTE — Progress Notes (Signed)
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 Erroll Luna)  #s/p PPV/MP/Retinectomy/EL/AfX/SO & Choroidal Drainage OS (Date 03/26/18)  - Plan to keep oil for 6 months  - Can consider PPV/SOR/SOI in future if membrane worsening or RD/RT. Not performing surgery for visual improvement but to preserve the eye.   - 06/06/18: OS no improvement with PH near    Plan:  - 5 loose corneal sutures removed 06/06/18   - Start ofloxacin QID x4 days then stop  - Continue PF QID OS (no taper)  - Discontinue cyclo  - Lacrilube QID more prn  - Seen with Cornea fellow 06/06/18; would not recommend any surgical intervention at this point for the cornea. Continue to observe.     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 Lavell Islam who agrees with the above assessment and plan.    [employer] Interpreter [phone]     I reviewed and confirmed all history components, including the HPI; and any other elements performed by the technician. I performed the key and critical portions of the exam. The final examination findings, image interpretations, and plan as documented in the record represent my personal judgement and conclusion.     Dorann Lodge MD.

## 2018-06-10 ENCOUNTER — Telehealth: Payer: Self-pay

## 2018-06-10 NOTE — Telephone Encounter (Signed)
Natalie Webb would like to know the outcome of last visit on 06/06/2018.  Please call him back. Thank you

## 2018-06-10 NOTE — Telephone Encounter (Signed)
Faxed note via Epic to [place] at [phone].

## 2018-06-17 ENCOUNTER — Telehealth: Payer: Self-pay

## 2018-06-17 NOTE — Telephone Encounter (Signed)
Patient is calling, in regards to her disability status. Stated her insurance is requesting to know how long she will need to be on disability. Patient also wanted to know if she can see a doctor for her back. States she is experiencing pain from her fall. Please give patient a call back. (Spanish)

## 2018-06-18 NOTE — Telephone Encounter (Signed)
Spoke to patient she's asking for a letter stating how long will she be in disability, restrictions, and how should she positioned how long, how long off work?

## 2018-06-24 ENCOUNTER — Encounter: Payer: Self-pay | Admitting: Student in an Organized Health Care Education/Training Program

## 2018-06-25 NOTE — Telephone Encounter (Signed)
Spoke to patient letter is ready, I'm mailing the letter to home address on file.

## 2018-07-03 ENCOUNTER — Telehealth: Payer: Self-pay

## 2018-07-03 ENCOUNTER — Ambulatory Visit: Payer: Worker's Comp, Other unspecified | Attending: Ophthalmology

## 2018-07-03 DIAGNOSIS — H5462 Unqualified visual loss, left eye, normal vision right eye: Secondary | ICD-10-CM | POA: Insufficient documentation

## 2018-07-03 DIAGNOSIS — H33052 Total retinal detachment, left eye: Secondary | ICD-10-CM | POA: Insufficient documentation

## 2018-07-03 DIAGNOSIS — S0532XD Ocular laceration without prolapse or loss of intraocular tissue, left eye, subsequent encounter: Secondary | ICD-10-CM | POA: Insufficient documentation

## 2018-07-03 MED ORDER — OFLOXACIN 0.3 % OP SOLN
1.0000 [drp] | Freq: Four times a day (QID) | OPHTHALMIC | 1 refills | Status: DC
Start: 2018-07-03 — End: 2018-07-21

## 2018-07-03 MED ORDER — PREDNISOLONE ACETATE 1 % OP SUSP
1.0000 [drp] | Freq: Four times a day (QID) | OPHTHALMIC | 0 refills | Status: DC
Start: 2018-07-03 — End: 2018-08-01

## 2018-07-03 NOTE — Telephone Encounter (Signed)
Pt states the letter she received to return to work was stating she wore glasses she needs this to be modified. Also she stated her right eye is getting tired because of her work environment

## 2018-07-03 NOTE — Progress Notes (Signed)
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 Vicie Mutters)  #s/p PPV/MP/Retinectomy/EL/AfX/SO & Choroidal Drainage OS (Date 03/26/18)  - Plan to keep oil for 6 months  - Can consider PPV/SOR/SOI in future if membrane worsening or RD/RT. Not performing surgery for visual improvement but to preserve the eye.     Today: macula attached but with diffuse scarring, planning on keeping SO in for 3 more months. 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 Murvin Natal and Caryn Section

## 2018-07-07 NOTE — Telephone Encounter (Signed)
Can you please modify her letter. thanks

## 2018-07-08 ENCOUNTER — Telehealth: Payer: Self-pay

## 2018-07-08 NOTE — Telephone Encounter (Signed)
Tammy from [employer] is calling for work status

## 2018-07-08 NOTE — Telephone Encounter (Signed)
Message to resident to complete work status note from 07/03/18 visit.

## 2018-07-09 NOTE — Telephone Encounter (Signed)
Letter faxed to [facility] [phone]

## 2018-07-10 NOTE — Telephone Encounter (Signed)
I modified the letter but we need to advise wearing safety glasses.

## 2018-07-11 NOTE — Telephone Encounter (Signed)
Spoke to patient mailed out letter to address on file

## 2018-07-21 ENCOUNTER — Telehealth: Payer: Self-pay

## 2018-07-21 ENCOUNTER — Ambulatory Visit: Payer: Worker's Comp, Other unspecified | Attending: Ophthalmology

## 2018-07-21 DIAGNOSIS — S0532XD Ocular laceration without prolapse or loss of intraocular tissue, left eye, subsequent encounter: Secondary | ICD-10-CM

## 2018-07-21 DIAGNOSIS — H33052 Total retinal detachment, left eye: Secondary | ICD-10-CM

## 2018-07-21 DIAGNOSIS — R519 Headache, unspecified: Secondary | ICD-10-CM

## 2018-07-21 DIAGNOSIS — H5712 Ocular pain, left eye: Secondary | ICD-10-CM | POA: Insufficient documentation

## 2018-07-21 DIAGNOSIS — R51 Headache: Secondary | ICD-10-CM

## 2018-07-21 DIAGNOSIS — H5462 Unqualified visual loss, left eye, normal vision right eye: Secondary | ICD-10-CM | POA: Insufficient documentation

## 2018-07-21 NOTE — Progress Notes (Signed)
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 I saw and examined the patient and reviewed in detail the findings of the resident/fellow. The final examination findings, image interpretations, and plan as documented in the record represent my personal judgment and conclusions.     Rashell Shambaugh M. Caryn Section, MD, PhD  Vitreoretinal Surgery Fellow  [place]    [place] Interpreter [phone]

## 2018-07-24 ENCOUNTER — Telehealth: Payer: Self-pay

## 2018-07-24 NOTE — Telephone Encounter (Signed)
Dr.Tasker states Dr. Murvin Natal is referring the pt to a neurologist and Dr. Hanley Hays is needing more information as to why we are referring the pt for neurology xfer to clinic spoke w/ Almyra Free

## 2018-07-24 NOTE — Addendum Note (Signed)
Addended by: Alison Murray on: 07/24/2018 03:32 PM     Modules accepted: Orders

## 2018-07-25 ENCOUNTER — Telehealth: Payer: Self-pay

## 2018-07-25 NOTE — Addendum Note (Signed)
Addended by: Alison Murray on: 07/25/2018 11:38 AM     Modules accepted: Orders, SmartSet

## 2018-07-25 NOTE — Telephone Encounter (Signed)
Alison Murray, MD  You 2 hours ago (2:32 PM)      Left voicemail trying to reach for the peer to peer    Routing comment

## 2018-07-25 NOTE — Telephone Encounter (Signed)
Dr. Debara Pickett (reviewing provider) is requesting the ordering clinic to do a peer-to-peer review for reference [phone] by calling [phone] by 07/28/18 at 3:00 PM Eastern Time.

## 2018-07-25 NOTE — Telephone Encounter (Signed)
Patient is spanish speaker.   Pt is requesting a letter that she will not be able to work she states that she needs it for her disability.   Pt would like call back once completed she will pick up at clinic.

## 2018-07-28 ENCOUNTER — Telehealth: Payer: Self-pay

## 2018-07-28 ENCOUNTER — Ambulatory Visit: Payer: Worker's Comp, Other unspecified

## 2018-07-28 NOTE — Telephone Encounter (Signed)
Transferred call to [facility] clinic. Pt returning Jenny's call. Spoke with Mable Fill at clinic. Thanks.

## 2018-07-28 NOTE — Anesthesia Preprocedure Evaluation (Addendum)
ANESTHESIA PRE-OPERATIVE EVALUATION    Patient Information    Name: Natalie Webb    MRN: [id number]    DOB: September 10, 1975    Age: 43 year old    Sex: female  Procedure(s):  P1 PARS PLANA VITRECTOMY, SILICONE OIL REMOVAL, MEMBRANE PEEL, ANDOLASER, AIRFLUID EXCHANGE, OIL RIGHT EYE      Pre-op Vitals:   There were no vitals taken for this visit.        Primary language spoken:  Spanish    ROS/Medical History:       History of Present Illness: Total retinal detachment of left eye     H/o left ruptured globe 03/13/18 from work related injury 03/13/18 Initial repair of ruptured globe and PPV left eye 03/26/18        General:  negative for General ROS  Hospitalist 08/07/18 optimized (epic)- Dr Kerrie Pleasure Cardiovascular:  negative cardio ROS  EKG (03/13/18): NSR, 63 bpm   Anesthesia History:  no history of anesthetic complications,  no family history of anesthetic complications,  PSH:  -C/s  03/13/18 Initial repair of ruptured globe as emergency. RSI glide, grade 1, 6.5  03/26/18 PPV left eye Mac4, Gr2 Pulmonary:   negative pulmonary ROS     Neuro/Psych:   Left ruptured globe 03/13/18 from work related injury Hematology/Oncology:   hematologic/lymphatic negative      GI/Hepatic:  negative GI/hepatic ROS Infectious Disease:  negative for infectious disease     Renal:  negative renal ROS   Endocrine/Other:  negative endo/other ROS     Pregnancy History:   Pediatrics:         Pre Anesthesia Testing (PCC/CPC) notes/comments:    [facility] Test & records reviewed by [facility] Provider.                      Preop acetaminophen ordered    Hospitalist 08/07/18 optimized (epic)- Dr Kerrie Pleasure                 Physical Exam    Airway:  Inter-inciser distance > 4 cm  Prognanth Able    Mallampati: II  Neck ROM: full  TM distance: 5-6 cm  Short thick neck: Yes        Cardiovascular:    Rhythm: regular   Rate: normal         Pulmonary:       breath sounds clear to auscultation        Neuro/Neck/Skeletal/Skin:  - [place] ANE PHYS EXAM NEGATIVE ROS SKIN SKELETAL  NEURO NECK          Dental:    Comment: Large gap between front teeth.  Denies any grossly loose.  Does have problematic tooth as noted      Abdominal:      General: obesity     Additional Clinical Notes:                          No past medical history on file.  Past Surgical History:   Procedure Laterality Date   . PARS PLANA VITRECTOMY, ENDOLASER, membrane peel, Silicone OIL,retinectomy LEFT EYE Left 03/26/2018     Social History     Tobacco Use   . Smoking status: Never Smoker   . Smokeless tobacco: Never Used   Substance Use Topics   . Alcohol use: Never     Frequency: Never   . Drug use: Not on file       No  current facility-administered medications for this encounter.      Current Outpatient Medications   Medication Sig Dispense Refill   . prednisoLONE acetate (PRED FORTE) 1 % ophthalmic suspension Place 1 drop into left eye 4 times daily. (Patient taking differently: Place 1 drop into left eye daily. ) 1 bottle 0     No Known Allergies    Labs and Other Data  Lab Results   Component Value Date    SODIUM 134 (L) 03/13/2018    K 4.1 03/13/2018    CL 102 03/13/2018    BUN 12 03/13/2018    CREAT 0.7 03/13/2018    GLU 97 03/13/2018    CA 11.4 (H) 03/13/2018     No results found for: AST, ALT, GGT, LDH, ALK, TP, ALB, TBILI, DBILI  Lab Results   Component Value Date    RBC 4.44 03/13/2018    HGB 11.9 03/13/2018    HCT 36.0 03/13/2018    MCV 81.2 (L) 03/13/2018    MCHC 33.2 03/13/2018    RDW 15.3 (H) 03/13/2018    PLT 424 (H) 03/13/2018    MPVH 7.4 03/13/2018     Lab Results   Component Value Date    INR 1.02 03/13/2018    PTT 27.9 03/13/2018     No results found for: ARTPH, ARTPO2, ARTPCO2    Anesthesia Plan:  Risks and Benefits of Anesthesia  I personally examined the patient immediately prior to the anesthetic and reviewed the pertinent medical history, drug and allergy history, laboratory and imaging studies and consultations. I have determined that the patient has had adequate assessment and  testing.    Anesthetic techniques, invasive monitors, anesthetic drugs for induction, maintenance and post-operative analgesia, risks and alternatives have been explained to the patient and/or patient's representatives.    I have prescribed the anesthetic plan:         Planned anesthesia method: General         ASA 2 (Mild systemic disease)     Potential anesthesia problems identified and risks including but not limited to the following were discussed with patient and/or patient's representative: Adverse or allergic drug reaction, Recall, Nerve injury, Dental injury or sore throat, Ocular injury, Death and Injury to brain, heart and other organs    No Beta Blocker Indicated: Patient not on beta blockersPlanned monitoring method: Routine monitoring  Comments: (No problems in past with anesthesia    Natalie Webb [phone])    Informed Consent:  Interpreter used: Over phone  Anesthetic plan and risks discussed with Patient.    Plan discussed with CRNA, Attending, Surgeon and OR Nurse.

## 2018-07-29 ENCOUNTER — Telehealth: Payer: Self-pay | Admitting: Retina Specialist

## 2018-07-29 ENCOUNTER — Encounter: Payer: Self-pay | Admitting: Ophthalmology

## 2018-07-29 ENCOUNTER — Ambulatory Visit: Payer: Worker's Comp, Other unspecified

## 2018-07-29 NOTE — Telephone Encounter (Signed)
Spoke to [place]. Provided information in regards to the authorization for surgery.

## 2018-07-29 NOTE — Telephone Encounter (Signed)
Left vm for Natalie Webb has questions on sx auth.

## 2018-07-29 NOTE — Telephone Encounter (Signed)
Letter is ready for pick up in front office. Will ask spanish speaker to call her to inform.

## 2018-07-30 ENCOUNTER — Encounter: Payer: Self-pay | Admitting: Ophthalmology

## 2018-07-30 NOTE — Telephone Encounter (Signed)
Pt.notified

## 2018-07-31 ENCOUNTER — Ambulatory Visit: Payer: Worker's Comp, Other unspecified

## 2018-07-31 ENCOUNTER — Telehealth: Payer: Self-pay | Admitting: Retina Specialist

## 2018-07-31 NOTE — Telephone Encounter (Signed)
Patient is calling requesting to speak to Anderson Malta in regards to previous notes. States she would also like to know if her authorization for surgery was approved. Please give her a call back. (Spanish speaker).

## 2018-08-01 ENCOUNTER — Other Ambulatory Visit: Payer: Self-pay | Admitting: Retina Specialist

## 2018-08-01 MED ORDER — PREDNISOLONE ACETATE 1 % OP SUSP
1.0000 [drp] | Freq: Every day | OPHTHALMIC | 0 refills | Status: DC
Start: 2018-08-01 — End: 2018-09-08

## 2018-08-01 NOTE — Telephone Encounter (Signed)
CALLED THE PATIENT AND LET HER KNOW THAT I CALLED [employer] COMP AND THE SURGERY AUTH HAS NOT BEEN APPROVED. IT WAS SENT ON 07/10 AND I GET THE SAME RESPONSE THAT IT WAS SENT TO THE ADJUSTER FOR REVIEW. HER SURGERY IS ON 08/13/18.

## 2018-08-04 ENCOUNTER — Ambulatory Visit: Payer: Worker's Comp, Other unspecified | Attending: Ophthalmology

## 2018-08-04 DIAGNOSIS — S0532XD Ocular laceration without prolapse or loss of intraocular tissue, left eye, subsequent encounter: Secondary | ICD-10-CM | POA: Insufficient documentation

## 2018-08-04 DIAGNOSIS — H33052 Total retinal detachment, left eye: Secondary | ICD-10-CM | POA: Insufficient documentation

## 2018-08-04 DIAGNOSIS — H5462 Unqualified visual loss, left eye, normal vision right eye: Secondary | ICD-10-CM | POA: Insufficient documentation

## 2018-08-04 DIAGNOSIS — H5712 Ocular pain, left eye: Secondary | ICD-10-CM | POA: Insufficient documentation

## 2018-08-04 MED ORDER — ATROPINE SULFATE 1 % OP SOLN
1.0000 [drp] | Freq: Three times a day (TID) | OPHTHALMIC | 0 refills | Status: DC
Start: 2018-08-04 — End: 2018-09-12

## 2018-08-04 NOTE — Progress Notes (Signed)
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 Vicie Mutters)    #s/p PPV/MP/Retinectomy/EL/AfX/SO & Choroidal Drainage OS (Date 03/26/18)  - Original plan to keep oil for 6 months, however worsening ERM now with TRD today  - Risks, benefits and alternatives including no intervention, observation and treatment options were presented and discussed in detail with the patient for PPV/SOR/MP/AFx/oil OS  The patient elects to proceed with surgery'    08/04/18  - Proceed w/ planned PPV/SOR/MP/EL OS (scheduled 7/29) - previously consented  - Continue PF BID OS  - START atropine BID OS  - Lacrilube QID more prn    #Headache  - splitting headache since trauma to left eye  - mildly helped with Tylenol  - no clear ocular explanation  Plan: referral to Neurology    #Glaucoma suspect based on C/D ratio  - unknown FHx  - IOP wnl  - OCT nfl full OD  - HVF OD with high total deviation, nonspecific pattern deficits  - 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 I saw and examined the patient and reviewed in detail the findings of the resident/fellow. The final examination findings, image interpretations, and plan as documented in the record represent my personal judgment and conclusions.     Paulla Dolly, MD  Vitreoretinal Surgery Fellow  [place]

## 2018-08-07 ENCOUNTER — Ambulatory Visit: Payer: Worker's Comp, Other unspecified | Attending: Hospitalist | Admitting: Hospitalist

## 2018-08-07 ENCOUNTER — Telehealth: Payer: Self-pay

## 2018-08-07 VITALS — BP 148/92 | HR 87 | Temp 97.7°F | Resp 16 | Ht 62.0 in | Wt 166.2 lb

## 2018-08-07 DIAGNOSIS — H3320 Serous retinal detachment, unspecified eye: Secondary | ICD-10-CM

## 2018-08-07 DIAGNOSIS — Z01818 Encounter for other preprocedural examination: Secondary | ICD-10-CM

## 2018-08-07 MED ORDER — ACETAMINOPHEN 325 MG OR TABS
650.00 mg | ORAL_TABLET | ORAL | Status: DC | PRN
Start: ? — End: 2018-08-21

## 2018-08-07 MED ORDER — FERROUS SULFATE 325 (65 FE) MG OR TABS: 325.00 mg | ORAL_TABLET | Freq: Three times a day (TID) | ORAL | Status: AC

## 2018-08-07 NOTE — Consults (Signed)
[facility]      Outpatient Preoperative Consultation    Date of Evaluation:  08/07/2018    Primary Care Physician:  No Pcp, Per Patient     Referring Physician: Lavinia Sharpsiazi Esfahani, Gwenyth BouillonMohammad    Reason for Referral:  Preoperative evaluation of ruptured globe   Type of Surgery: P1 PARS PLANA VITRECTOMY, SILICONE OIL REMOVAL, MEMBRANE PEEL, ANDOLASER, AIRFLUID EXCHANGE, OIL RIGHT EYEP1 PARS PLANA VITRECTOMY, SILICONE OIL REMOVAL, MEMBRANE PEEL, ANDOLASER, AIRFLUID EXCHANGE, OIL RIGHT EYE   Proposed Anesthesia: local           History of Present Illness:  This is a 43 year old female with no past medical history who presents for preop eval of total detachment of retina from left eye. Planned surgery is: P1 PARS PLANA VITRECTOMY, SILICONE OIL REMOVAL, MEMBRANE PEEL, ANDOLASER, AIRFLUID EXCHANGE, OIL RIGHT EYEP1 PARS PLANA VITRECTOMY, SILICONE OIL REMOVAL, MEMBRANE PEEL, ANDOLASER, AIRFLUID EXCHANGE, OIL RIGHT EYE. She endorses headache today, takes eye drops which help with this.     Cardiovascular Risk Assessment:    Recent Cardiac Testing (<5 years)    Echocardiogram [] :    Stress Test , [] :    Cardiac Catheterization [] :    Other , [] :      Cardiac Symptoms: no chest pain, shortness of breath, DOE, orthopnea, PND, syncope, or palpitations   Cardiac History: no history of CAD, HF, or arrhythmia   Major Cardiac Contraindications: none   Procedural Risk: low   Hypertension: No history of hypertension.     Pulmonary Risk Assessment:    Pulmonary History or Symptoms: no chronic lung disease   Surgery a/w Pulmonary Complications: no   Potential Pulmonary Contraindications: none   Major Pulmonary Risk Factors: none     Allergies:    No Known Allergies    Medications:     No outpatient medications have been marked as taking for the 08/07/18 encounter (Appointment) with HOSPITALIST PRE-OP PRACTICE.       Past Medical/Surgical History:    No past medical history on file.    Family History:     Noncontributory for CAD  or coagulopathy  No family history on file.    Social History:    Social History     Tobacco Use   . Smoking status: Never Smoker   . Smokeless tobacco: Never Used   Substance Use Topics   . Alcohol use: Never     Frequency: Never   . Drug use: Not on file       Review Of Systems:      Infectious: Reports no fever, sinus congestion, cough, sputum, dysuria, or diarrhea   Immunological:  Reports recent no recent immunosuppressive therapy, weight loss, or immunocompromised state   Endocrine:  Reports no diabetes, thyroid disease, or risk factors for adrenal suppression    Hematological:  Reports no recent use of antiplatelet drugs or anticoagulants, easy bruising or bleeding, coagulopathy, active malignancy, or risk factors for thrombosis   Other:       The remaining Review of Systems is negative.     Physical Exam:     Vital Signs: There were no vitals taken for this visit.      General: NAD, resting in bed  HEENT: tearing of the left eye, mild swelling  Pulmonary: No wheezing, no crackles  Cardiovascular: No murmurs, no edema  GI: Soft, nontender  Neuro: Alert and oriented without focal neurological deficits  Psych: Normal affect, appropriate mood   Derm: Skin warm,  no rashes      Diagnostic Data:    EKG [03/13/18]: normal sinus, rhythm without ischemic changes         Laboratory Data:     Common Labs:  Lab Results   Component Value Date    WBCCOUNT 10.9 (H) 03/13/2018    HGB 11.9 03/13/2018    HCT 36.0 03/13/2018    PLT 424 (H) 03/13/2018    RBC 4.44 03/13/2018    MCV 81.2 (L) 03/13/2018    MCH 26.9 (L) 03/13/2018    MCHC 33.2 03/13/2018    MPVH 7.4 03/13/2018    RDW 15.3 (H) 03/13/2018    NEUTP 77.1 03/13/2018    NEUTAB 8.4 (H) 03/13/2018    LYMPP 16.8 03/13/2018    LYMAB2 1.8 03/13/2018    MONOSP 5.1 03/13/2018    MONOAB 0.6 03/13/2018    EOSNP 0.3 03/13/2018    EOSAB 0.0 03/13/2018    BASOP 0.7 03/13/2018    BASOAB 0.1 03/13/2018     Lab Results   Component Value Date/Time    SODIUM 134 (L) 03/13/2018 07:20 PM     K 4.1 03/13/2018 07:20 PM    CL 102 03/13/2018 07:20 PM    CO2 25 03/13/2018 07:20 PM    BUN 12 03/13/2018 07:20 PM    CREAT 0.7 03/13/2018 07:20 PM    GLU 97 03/13/2018 07:20 PM    [place] 11.4 (H) 03/13/2018 07:20 PM     Lab Results   Component Value Date/Time    PROTIME 13.4 03/13/2018 07:20 PM    INR 1.02 03/13/2018 07:20 PM    PTT 27.9 03/13/2018 07:20 PM     No results found for: A1C  No results found for: TSH, TSHHS, FREET4, T4        Impression:  This is a 43 year old female with no past medical history who presents for preop eval of total detachment of retina from left eye. Planned surgery is: P1 PARS PLANA VITRECTOMY, SILICONE OIL REMOVAL, MEMBRANE PEEL, ANDOLASER, AIRFLUID EXCHANGE, OIL RIGHT EYEP1 PARS PLANA VITRECTOMY, SILICONE OIL REMOVAL, MEMBRANE PEEL, ANDOLASER, AIRFLUID EXCHANGE, OIL RIGHT EYE.    Assessment:  . Cardiac Risk:  Procedure Urgency: time-sensitive   Major Cardiac Contraindications: none   Procedural Risk: low risk   Post-Test Cardiac Risk (if known): no recent coronary evaluation or intervention    Overall Cardiac Risk for MACE or MICA: low (<2%)    .     Marland Kitchen Postoperative Pulmonary Complication (PPC) Risk:  Pulmonary Dx: no chronic lung disease   Surgery-Specific Risk Factors: low risk   Major Patient-Specific Risk Factors: no   Overall Risk for PPC: low risk       Recommendations:  Marland Kitchen Medical Optimization:  Preoperative Testing: none indicated   Medical Optimization: is optimized for the proposed surgery with the following interventions below.   Patient/Family Education:  Risk of medical co-morbidity contributing to risk of surgery, risk of delaying surgery, perioperative medication adjustments.  Written instructions were provided.   Return to Clinic: PRN     . The patient needs no further cardiac evaluation or cardiac optimization.  . Hold all vitamins, supplements, and herbal medications on the day of surgery.  . Continue all other usual medications, including on the day of  surgery.  . Recommend VTE prophylaxis per approved algorithm order set.      I personally reviewed and interpreted EKG.        Kerrie Pleasure, MD   [facility]  Program

## 2018-08-07 NOTE — Patient Instructions (Signed)
Thank you for visiting the pre-operative Internal Medicine clinic today. Below are recommendations regarding your plan of care prior to proceeding with surgery.    Further tests needing to be performed:  - No further tests need to be performed prior to proceeding with surgery    Pain medication  - Do not take any NSAIDs (Ibuprofen, naproxen) on the day prior to procedure    Other medications  - Hold all vitamins, supplements, and herbal medications on the day of surgery.    Diet  - Do not eat at least 8h prior to procedure

## 2018-08-07 NOTE — Telephone Encounter (Signed)
Patient walked in stating she needs [employer] form filled out, she was told by her lawyer to contact us to fill out form therefore she can send form to adjuster.

## 2018-08-11 ENCOUNTER — Ambulatory Visit: Payer: Worker's Comp, Other unspecified | Attending: Ophthalmology

## 2018-08-11 DIAGNOSIS — Z01812 Encounter for preprocedural laboratory examination: Secondary | ICD-10-CM | POA: Insufficient documentation

## 2018-08-11 DIAGNOSIS — Z1159 Encounter for screening for other viral diseases: Secondary | ICD-10-CM | POA: Insufficient documentation

## 2018-08-11 DIAGNOSIS — H33052 Total retinal detachment, left eye: Secondary | ICD-10-CM

## 2018-08-11 NOTE — Interdisciplinary (Signed)
Verified name and DOB, pt states COVID19 test ordered for preop screening. Performed nasal-pharyngeal swab, pt tolerated well. No s/s of distress noted.

## 2018-08-11 NOTE — Telephone Encounter (Signed)
CALLED L/M TO CALL us BACK REGARDING THE FORMS     Shirlee Limerick, MD  You; Gale Journey, Louanna Raw, MD 1 hour ago (1:38 PM)      Please have patient bring the form to clinic so we can evaluate it. Thank you!

## 2018-08-12 ENCOUNTER — Other Ambulatory Visit: Payer: PRIVATE HEALTH INSURANCE

## 2018-08-12 LAB — CORONAVIRUS DISEASE 2019 (COVID-19) SCOV
COVID-19 Comment: NOT DETECTED
COVID-19 Result: NOT DETECTED

## 2018-08-12 MED ORDER — MOXIFLOXACIN HCL 0.5 % OP SOLN
1.0000 [drp] | Freq: Four times a day (QID) | OPHTHALMIC | 0 refills | Status: DC
Start: 2018-08-12 — End: 2018-09-12
  Filled 2018-08-12: qty 3, 1d supply, fill #0

## 2018-08-12 MED ORDER — PREDNISOLONE ACETATE 1 % OP SUSP
1.0000 [drp] | Freq: Four times a day (QID) | OPHTHALMIC | 0 refills | Status: DC
Start: 2018-08-12 — End: 2018-09-12
  Filled 2018-08-12: qty 5, 1d supply, fill #0

## 2018-08-12 MED ORDER — ANTERIOR BLUE EYE KIT (WAM) (~~LOC~~)
PACK | 0 refills | Status: AC
Start: 2018-08-12 — End: ?
  Filled 2018-08-12: qty 1, 1d supply, fill #0

## 2018-08-12 MED ORDER — CYCLOPENTOLATE HCL 1 % OP SOLN
1.0000 [drp] | Freq: Four times a day (QID) | OPHTHALMIC | 0 refills | Status: DC
Start: 2018-08-12 — End: 2018-09-12
  Filled 2018-08-12: qty 15, 1d supply, fill #0

## 2018-08-13 ENCOUNTER — Ambulatory Visit (HOSPITAL_BASED_OUTPATIENT_CLINIC_OR_DEPARTMENT_OTHER): Payer: Worker's Comp, Other unspecified

## 2018-08-13 ENCOUNTER — Ambulatory Visit
Admission: RE | Admit: 2018-08-13 | Discharge: 2018-08-13 | Disposition: A | Discharge status: Home / Self Care | Payer: Worker's Comp, Other unspecified | Attending: Retina Specialist | Admitting: Retina Specialist

## 2018-08-13 ENCOUNTER — Ambulatory Visit: Payer: Worker's Comp, Other unspecified

## 2018-08-13 ENCOUNTER — Encounter: Admission: RE | Disposition: A | Discharge status: Home Health | Payer: Self-pay | Attending: Retina Specialist

## 2018-08-13 DIAGNOSIS — H33052 Total retinal detachment, left eye: Secondary | ICD-10-CM | POA: Insufficient documentation

## 2018-08-13 DIAGNOSIS — Z6831 Body mass index (BMI) 31.0-31.9, adult: Secondary | ICD-10-CM | POA: Insufficient documentation

## 2018-08-13 DIAGNOSIS — E669 Obesity, unspecified: Secondary | ICD-10-CM | POA: Insufficient documentation

## 2018-08-13 HISTORY — PX: PARS PLANA REPAIR OF RETINAL DEATACHMENT: SHX2165

## 2018-08-13 LAB — BHCG (ISTAT) POC (LIO): Beta-hCG (iSTAT): <5 m[IU]/mL (ref <5)

## 2018-08-13 SURGERY — REPAIR, RETINAL DETACHMENT
Anesthesia: General | Site: Eye | Laterality: Right | Wound class: Class I (Clean)

## 2018-08-13 MED ORDER — MIDAZOLAM HCL 2 MG/2ML IJ SOLN
INTRAMUSCULAR | Status: DC | PRN
Start: 2018-08-13 — End: 2018-08-13
  Administered 2018-08-13 (×2): 2 mg via INTRAVENOUS

## 2018-08-13 MED ORDER — LACTATED RINGERS IV SOLN
INTRAVENOUS | Status: DC | PRN
Start: 2018-08-13 — End: 2018-08-13
  Administered 2018-08-13: 10:00:00 via INTRAVENOUS

## 2018-08-13 MED ORDER — DEXAMETHASONE SODIUM PHOSPHATE 4 MG/ML IJ SOLN (CUSTOM)
INTRAMUSCULAR | Status: AC
Start: 2018-08-13 — End: 2018-08-13
  Filled 2018-08-13: qty 1

## 2018-08-13 MED ORDER — PHENYLEPHRINE HCL 2.5 % OP SOLN
1.0000 [drp] | OPHTHALMIC | Status: AC
Start: 2018-08-13 — End: 2018-08-13
  Administered 2018-08-13 (×3): 1 [drp] via OPHTHALMIC

## 2018-08-13 MED ORDER — BUPIVACAINE HCL (PF) 0.75 % IJ SOLN
INTRAMUSCULAR | Status: AC
Start: 2018-08-13 — End: 2018-08-13
  Filled 2018-08-13: qty 30

## 2018-08-13 MED ORDER — ONDANSETRON HCL 4 MG/2ML IV SOLN
INTRAMUSCULAR | Status: DC | PRN
Start: 2018-08-13 — End: 2018-08-13
  Administered 2018-08-13 (×2): 4 mg via INTRAVENOUS

## 2018-08-13 MED ORDER — OXYCODONE HCL 5 MG OR TABS
10.0000 mg | ORAL_TABLET | ORAL | Status: DC | PRN
Start: 2018-08-13 — End: 2018-08-13

## 2018-08-13 MED ORDER — TRIAMCINOLONE ACETONIDE 40 MG/ML IJ SUSP
INTRAMUSCULAR | Status: DC | PRN
Start: 2018-08-13 — End: 2018-08-13
  Administered 2018-08-13 (×2): 40 mg

## 2018-08-13 MED ORDER — SODIUM CHLORIDE 0.9 % IV SOLN
12.5000 mg | Freq: Once | INTRAVENOUS | Status: DC | PRN
Start: 2018-08-13 — End: 2018-08-13

## 2018-08-13 MED ORDER — FENTANYL CITRATE (PF) 100 MCG/2ML IJ SOLN
INTRAMUSCULAR | Status: AC
Start: 2018-08-13 — End: 2018-08-13
  Filled 2018-08-13: qty 2

## 2018-08-13 MED ORDER — LIDOCAINE HCL 2 % EX GEL
CUTANEOUS | Status: AC
Start: 2018-08-13 — End: 2018-08-13
  Filled 2018-08-13: qty 5

## 2018-08-13 MED ORDER — LIDOCAINE HCL 20 MG/ML IV INJECTION WRAPPED RECORD
INTRAVENOUS | Status: DC | PRN
Start: 2018-08-13 — End: 2018-08-13
  Administered 2018-08-13 (×2): 100 mg via INTRAVENOUS

## 2018-08-13 MED ORDER — POVIDONE-IODINE 5 % OP SOLN
OPHTHALMIC | Status: AC
Start: 2018-08-13 — End: 2018-08-13
  Filled 2018-08-13: qty 30

## 2018-08-13 MED ORDER — ATROPINE SULFATE 1 % OP SOLN
OPHTHALMIC | Status: AC
Start: 2018-08-13 — End: 2018-08-13
  Filled 2018-08-13: qty 5

## 2018-08-13 MED ORDER — NEOMYCIN-POLYMYXIN-DEXAMETH 3.5-10000-0.1 OP OINT
TOPICAL_OINTMENT | OPHTHALMIC | Status: DC | PRN
Start: 2018-08-13 — End: 2018-08-13
  Administered 2018-08-13: 1 via OPHTHALMIC

## 2018-08-13 MED ORDER — CEFAZOLIN OPHTHALMIC INJ 50 MG/ML (~~LOC~~)
SUBCONJUNCTIVAL | Status: DC | PRN
Start: 2018-08-13 — End: 2018-08-13
  Administered 2018-08-13 (×2): .3 mL via SUBCONJUNCTIVAL

## 2018-08-13 MED ORDER — MEPERIDINE HCL 25 MG/ML IJ SOLN
12.5000 mg | INTRAMUSCULAR | Status: DC | PRN
Start: 2018-08-13 — End: 2018-08-13

## 2018-08-13 MED ORDER — ROCURONIUM BROMIDE 100 MG/10ML IV SOLN
INTRAVENOUS | Status: DC | PRN
Start: 2018-08-13 — End: 2018-08-13
  Administered 2018-08-13: 12:00:00 20 mg via INTRAVENOUS
  Administered 2018-08-13: 11:00:00 70 mg via INTRAVENOUS
  Administered 2018-08-13 (×2): 20 mg via INTRAVENOUS

## 2018-08-13 MED ORDER — LIDOCAINE HCL 1 % IJ SOLN
INTRAMUSCULAR | Status: AC
Start: 2018-08-13 — End: 2018-08-13
  Filled 2018-08-13: qty 20

## 2018-08-13 MED ORDER — DIPHENHYDRAMINE HCL 50 MG/ML IJ SOLN
25.0000 mg | Freq: Once | INTRAMUSCULAR | Status: DC | PRN
Start: 2018-08-13 — End: 2018-08-13

## 2018-08-13 MED ORDER — NA CHONDROIT SULF-NA HYALURON 40-30 MG/ML IO SOLN
INTRAOCULAR | Status: DC | PRN
Start: 2018-08-13 — End: 2018-08-13
  Administered 2018-08-13: 0.75 mL via INTRAOCULAR

## 2018-08-13 MED ORDER — STERILE WATER FOR IRRIGATION IR SOLN
Status: DC | PRN
Start: 2018-08-13 — End: 2018-08-13
  Administered 2018-08-13: 11:00:00 1000 mL

## 2018-08-13 MED ORDER — ACETAMINOPHEN 500 MG OR TABS
1000.0000 mg | ORAL_TABLET | Freq: Once | ORAL | Status: AC
Start: 2018-08-13 — End: 2018-08-13
  Administered 2018-08-13: 1000 mg via ORAL
  Filled 2018-08-13: qty 2

## 2018-08-13 MED ORDER — DEXAMETHASONE SODIUM PHOSPHATE 4 MG/ML IJ SOLN (CUSTOM)
INTRAMUSCULAR | Status: DC | PRN
Start: 2018-08-13 — End: 2018-08-13
  Administered 2018-08-13: 4 mg via INTRAVENOUS

## 2018-08-13 MED ORDER — EPINEPHRINE PF 1 MG/ML IJ SOLN
INTRAOCULAR | Status: DC | PRN
Start: 2018-08-13 — End: 2018-08-13
  Administered 2018-08-13: 500 mL

## 2018-08-13 MED ORDER — TETRACAINE HCL 0.5 % OP SOLN
1.0000 [drp] | OPHTHALMIC | Status: AC
Start: 2018-08-13 — End: 2018-08-13
  Administered 2018-08-13 (×4): 1 [drp] via OPHTHALMIC
  Filled 2018-08-13: qty 4

## 2018-08-13 MED ORDER — ROCURONIUM BROMIDE 50 MG/5ML IV SOLN
INTRAVENOUS | Status: AC
Start: 2018-08-13 — End: 2018-08-13
  Filled 2018-08-13: qty 5

## 2018-08-13 MED ORDER — FENTANYL CITRATE (PF) 100 MCG/2ML IJ SOLN
25.0000 ug | INTRAMUSCULAR | Status: DC | PRN
Start: 2018-08-13 — End: 2018-08-13
  Administered 2018-08-13 (×4): 25 ug via INTRAVENOUS

## 2018-08-13 MED ORDER — CYCLOPENTOLATE HCL 1 % OP SOLN
1.0000 [drp] | OPHTHALMIC | Status: AC
Start: 2018-08-13 — End: 2018-08-13
  Administered 2018-08-13 (×4): 1 [drp] via OPHTHALMIC

## 2018-08-13 MED ORDER — EPINEPHRINE HCL 1 MG/ML IJ SOLN (CUSTOM)
INTRAMUSCULAR | Status: AC
Start: 2018-08-13 — End: 2018-08-13
  Filled 2018-08-13: qty 1

## 2018-08-13 MED ORDER — BSS IO SOLN
INTRAOCULAR | Status: DC | PRN
Start: 2018-08-13 — End: 2018-08-13
  Administered 2018-08-13 (×2): 15 mL via TOPICAL

## 2018-08-13 MED ORDER — BUPIVACAINE HCL (PF) 0.75 % IJ SOLN
INTRAMUSCULAR | Status: DC | PRN
Start: 2018-08-13 — End: 2018-08-13
  Administered 2018-08-13 (×2): 3 mL

## 2018-08-13 MED ORDER — SUGAMMADEX SODIUM 200 MG/2ML IV SOLN
INTRAVENOUS | Status: AC
Start: 2018-08-13 — End: 2018-08-13
  Filled 2018-08-13: qty 2

## 2018-08-13 MED ORDER — ATROPINE SULFATE 1 % OP SOLN
OPHTHALMIC | Status: DC | PRN
Start: 2018-08-13 — End: 2018-08-13
  Administered 2018-08-13: 1 [drp] via OPHTHALMIC

## 2018-08-13 MED ORDER — PROPOFOL 200 MG/20ML IV EMUL
INTRAVENOUS | Status: DC | PRN
Start: 2018-08-13 — End: 2018-08-13
  Administered 2018-08-13 (×2): 150 mg via INTRAVENOUS

## 2018-08-13 MED ORDER — CEFAZOLIN SODIUM 1 GM IJ SOLR
INTRAMUSCULAR | Status: AC
Start: 2018-08-13 — End: 2018-08-13
  Filled 2018-08-13: qty 1000

## 2018-08-13 MED ORDER — NEOMYCIN-POLYMYXIN-DEXAMETH 0.1 % OP OINT
TOPICAL_OINTMENT | OPHTHALMIC | Status: AC
Start: 2018-08-13 — End: 2018-08-13
  Filled 2018-08-13: qty 3.5

## 2018-08-13 MED ORDER — METHYLPREDNISOLONE SOD SUCCINATE OPHTHALMIC INJ 20 MG/ML (~~LOC~~)
SUBCONJUNCTIVAL | Status: DC | PRN
Start: 2018-08-13 — End: 2018-08-13
  Administered 2018-08-13 (×2): .3 mL via SUBCONJUNCTIVAL

## 2018-08-13 MED ORDER — FENTANYL CITRATE (PF) 100 MCG/2ML IJ SOLN
INTRAMUSCULAR | Status: DC | PRN
Start: 2018-08-13 — End: 2018-08-13
  Administered 2018-08-13 (×2): 100 ug via INTRAVENOUS

## 2018-08-13 MED ORDER — NALOXONE HCL 0.4 MG/ML IJ SOLN
0.2000 mg | Freq: Once | INTRAMUSCULAR | Status: DC | PRN
Start: 2018-08-13 — End: 2018-08-13

## 2018-08-13 MED ORDER — PROPOFOL 200 MG/20ML IV EMUL
INTRAVENOUS | Status: AC
Start: 2018-08-13 — End: 2018-08-13
  Filled 2018-08-13: qty 20

## 2018-08-13 MED ORDER — ONDANSETRON HCL 4 MG/2ML IV SOLN
INTRAMUSCULAR | Status: AC
Start: 2018-08-13 — End: 2018-08-13
  Filled 2018-08-13: qty 2

## 2018-08-13 MED ORDER — PLASMA-LYTE A IV SOLN
INTRAVENOUS | Status: DC
Start: 2018-08-13 — End: 2018-08-13

## 2018-08-13 MED ORDER — METHYLPREDNISOLONE SODIUM SUCC 40 MG IJ SOLR CUSTOM
INTRAMUSCULAR | Status: AC
Start: 2018-08-13 — End: 2018-08-13
  Filled 2018-08-13: qty 40

## 2018-08-13 MED ORDER — SUGAMMADEX SODIUM 500 MG/5ML IV SOLN
INTRAVENOUS | Status: DC | PRN
Start: 2018-08-13 — End: 2018-08-13
  Administered 2018-08-13: 13:00:00 200 mg via INTRAVENOUS

## 2018-08-13 MED ORDER — MOXIFLOXACIN HCL 0.5 % OP SOLN
1.0000 [drp] | OPHTHALMIC | Status: AC
Start: 2018-08-13 — End: 2018-08-13
  Administered 2018-08-13 (×3): 1 [drp] via OPHTHALMIC

## 2018-08-13 MED ORDER — BSS IO SOLN
INTRAOCULAR | Status: AC
Start: 2018-08-13 — End: 2018-08-13
  Filled 2018-08-13: qty 45

## 2018-08-13 MED ORDER — HYDROCODONE-ACETAMINOPHEN 5-325 MG OR TABS
1.0000 | ORAL_TABLET | ORAL | Status: DC | PRN
Start: 2018-08-13 — End: 2018-08-13
  Administered 2018-08-13 (×2): 1 via ORAL
  Filled 2018-08-13: qty 1

## 2018-08-13 MED ORDER — MIDAZOLAM HCL (PF) 2 MG/2ML IJ SOLN
INTRAMUSCULAR | Status: AC
Start: 2018-08-13 — End: 2018-08-13
  Filled 2018-08-13: qty 2

## 2018-08-13 MED ORDER — LACTATED RINGERS IV SOLN
10.0000 mL/h | INTRAVENOUS | Status: DC
Start: 2018-08-13 — End: 2018-08-13
  Administered 2018-08-13: 08:00:00 10 mL/h via INTRAVENOUS

## 2018-08-13 MED ORDER — OXYCODONE HCL 5 MG OR TABS
5.0000 mg | ORAL_TABLET | ORAL | Status: DC | PRN
Start: 2018-08-13 — End: 2018-08-13

## 2018-08-13 MED ORDER — FENTANYL CITRATE (PF) 100 MCG/2ML IJ SOLN
50.0000 ug | INTRAMUSCULAR | Status: DC | PRN
Start: 2018-08-13 — End: 2018-08-13
  Filled 2018-08-13: qty 2

## 2018-08-13 MED ORDER — PREDNISOLONE ACETATE 1 % OP SUSP
1.0000 [drp] | OPHTHALMIC | Status: AC
Start: 2018-08-13 — End: 2018-08-13
  Administered 2018-08-13 (×4): 1 [drp] via OPHTHALMIC

## 2018-08-13 MED ORDER — ONDANSETRON HCL 4 MG/2ML IV SOLN
4.0000 mg | Freq: Once | INTRAMUSCULAR | Status: DC | PRN
Start: 2018-08-13 — End: 2018-08-13

## 2018-08-13 MED ORDER — POVIDONE-IODINE 5 % OP SOLN
OPHTHALMIC | Status: DC | PRN
Start: 2018-08-13 — End: 2018-08-13
  Administered 2018-08-13: 2 [drp] via OPHTHALMIC

## 2018-08-13 SURGICAL SUPPLY — 25 items
CANISTER SUCTION POROUS 2000 CC RIGID LOCK LID STANDARD ELBOW PORT SHUTOFF VALVE LATEX FREE DISPOSAB (Misc Medical Supply) ×2 IMPLANT
CANNULA IRRIG ILLUM DUAL BORE 23 G 3239 (Needles/punch/cannula/biopsy) ×2 IMPLANT
CAUTERY OPHTHM ERASER 25GA ST. (Laser Fibers/Probes) ×2 IMPLANT
CHANDELIER 25 G RIFID ALCON (Disp Instruments) IMPLANT
COVER HEAD MICROSCOPE INSIGHT (Drapes/towels) ×2 IMPLANT
DRESSING MED. TEGADERM 4X4 (Dressings/packing) ×2 IMPLANT
FOG REDUCTION ENDOSCOPIC DEV (Non-Pharmacy Meds/Solutions) ×2 IMPLANT
FORCEPS SERRATED OPHTH TIP 23 GA (Disp Instruments) ×2 IMPLANT
INSERTER CANNULA 23GA DISP ONE STEP (Misc Surgical Supply) IMPLANT
KIT VITRECTOMY STELLARIS PC EXTRACTION VISCOUS FLUID HIGH PERFORMANCE VALVE 23 GA STERILE LATEX FREE (Kits/Sets/Trays) IMPLANT
NEEDLE OPHTH 23 GA SOFT TIP DISP (Needles/punch/cannula/biopsy) IMPLANT
NEEDLE OPHTH 25 GA SOFT TIP DISP (Needles/punch/cannula/biopsy) ×2 IMPLANT
OIL REMOVAL CANNULA 23 KAPRAN (Needles/punch/cannula/biopsy) ×2 IMPLANT
OIL SILC ADATOSIL 5000 SYR ES5000S (Eye implants- synthetic) ×2 IMPLANT
PACK RETINA B&L 23 GA (Procedure Packs/kits) ×2 IMPLANT
PACK RETINA REV 1 (Procedure Packs/kits) ×2 IMPLANT
PACK VITRECTOMY 23G COMBINED STELLARIS ELITE (Procedure Packs/kits) ×2 IMPLANT
POLYTIP VFI CANNULA 23G 7MM 3235 (Needles/punch/cannula/biopsy) ×2 IMPLANT
Pack Vitrectomy Viscous Fluid Control Universal (Kits/Sets/Trays) ×4 IMPLANT
Perfluoron ×1 IMPLANT
STELLARIS PC-LASERSONDEN CURVED 23G (Laser Fibers/Probes) ×2 IMPLANT
SUTURE VICRYL TG140-8 7-0 DA VIO 18" J546 (Suture) ×2 IMPLANT
SYRINGE 5ML LUER LOCK TIP (Misc Medical Supply) ×2 IMPLANT
SYRINGE TB 1ML 25GX5/8" (Misc Medical Supply) ×2 IMPLANT
Scissors Ophthalmic 23G Curved Grieshaber Evolution Disposable (Lap/Endo/Arthroscopy) IMPLANT

## 2018-08-13 NOTE — Telephone Encounter (Signed)
L/m for patient to bring the form or faxed [place] the form

## 2018-08-13 NOTE — Anesthesia Postprocedure Evaluation (Signed)
Anesthesia Post Note    Patient: Natalie Webb    Procedure(s) Performed: Procedure(s):  P1 PARS PLANA VITRECTOMY, SILICONE OIL REMOVAL, MEMBRANE PEEL, ENDOLASER, AIRFLUID EXCHANGE, OIL RIGHT EYE      Final anesthesia type: General    Patient location: PACU    Post anesthesia pain: adequate analgesia    Mental status: awake, alert  and oriented    Airway Patent: Yes    Last Vitals:    Vitals Value Taken Time   BP 138/75 08/13/2018  3:00 PM   Temp 36.2 C 08/13/2018  3:00 PM   Pulse 93 08/13/2018  3:00 PM   Resp 15 08/13/2018  3:00 PM   SpO2 95 % 08/13/2018  3:00 PM        Temperature >35.5: Yes    Post vital signs: stable    Hydration: adequate    N/V:no    Plan of care per primary team.

## 2018-08-13 NOTE — Discharge Instructions (Signed)
Please keep your patch and shield on until seen by your doctor tomorrow at:    2nd floor, [facility]  [address]  [place], [place]    Please bring your drops with you.  Please be face down

## 2018-08-13 NOTE — Plan of Care (Signed)
Problem: Promotion of Perioperative Health and Safety  Goal: Promotion of Health and Safety of the Perioperative Patient  Description  The patient remains safe, receives treatment appropriate to the surgical intervention and patient's physiological needs and is discharged or transferred to the appropriate level of care.  Flowsheets (Taken 08/13/2018 1536)  Standard of Care: PACU SOC  Individualized Interventions and Recommendations: pt will be free of ponv and have a pain score of 0-3

## 2018-08-13 NOTE — Op Note (Signed)
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    POSTOPERATIVE DIAGNOSIS:  Same    OPERATION:  Pars plana vitrectomy, silicone oil removal, membrane peel, PFO, endolaser, silicone oil injection of the Left eye.    ANESTHESIA:  GENERAL    COMPLICATIONS:  None.    SPECIMENS:  None.    EBL:  Minimal      Indications for Surgery  The patient is a54 year old female who presented to clinic after ruptured globe repair of the left eye following a traumatic incident in Feb 2020. She was found to have a retinal detachment and underwent surgery in March 2020. On post-operative follow up, she was noted to have a tractional retinal detachment. Extensive discussion about the risks, benefits, alternatives for surgical repair of the eye with the patient. The patient consented to the procedure and was scheduled for surgery.    Description of Procedure:    The patient, Natalie Webb gave informed consent for the following operation which was  performed under general anesthesia care. A time out was performed and confirmed by all in the room that the Left eye was the correct operative eye. Following an induction of general anesthesia, The eye was prepped and draped in the usual sterile fashion for vitrectomy surgery.     The cornea was kept moist with viscoat placed on the surface throughout the operation. A 23 gauge vitrectomy system was used. Initial entry into the eye was gained with 3 trocars placed 66mm from the limbus. In the inferotemporal location, the infusion cannula was secured in position. The open end of the cannula was visualized through the dilated pupil and the infusion was turned on when it was in the correct position. The 2 superior sclerotomies were used for entry of a light pipe and a oil extraction cannula. The silicone oil was removed from the  eye.    The 2 superior sclerotomies were then used for entry of a light pipe and a vitrectomy cutter. Upon entry into the eye, it was noted that the retina was detached with significant membranes noted inferiorly. Subretinal silicone oil was noted inferotemporal. Dilute triamcinolone was place on the macula to identify membranes. The vitrectomy cutter used to released membranes inferotemporally and release anterior-posterior traction. Using intraocular forceps, the membranes were delicately removed from the retina. The temporal subretinal silicone oil was released and then removed with the silicone oil extraction cannula. The prior retinectomy edges were freshened with diathermy and the vitrectomy cutter inferiorly to reveal subretinal peg and fibrosis inferotemporal. This was removed with intraocular forceps. A focal area of hemorrhage was noted superotemporal that was treated with local diathermy. PFO was placed to flatten the retina. Using the endolaser retinopexy was applied to the edges of the prior retinectomy. A fluid-air exchange was performed and all PFO was removed with the soft tip cannula .     The eye was filled with 1610 centistoke silicone oil to physiologic pressure. The trocars were removed and the sclerotomies were sutured be watertight. The intraocular pressure remained normal. Betadine was place on the eye. Ancef and Solumedrol, 0.75% marcaine, and full strength kenalog  were injected into the sub-Tenon space. The eye was irrigated with BSS. A drop of atropine and Maxitrol ointment were placed on the eye, which was then patched and shielded. There  were no complications.    The patient returned to the PACU in stable condition and will follow up tomorrow in the eye clinic.    Due to the complexity of this case, there was no qualified resident able to assist.

## 2018-08-13 NOTE — H&P (Signed)
HISTORY & PHYSICAL - INTERVAL ASSESSMENT & UPDATE     Natalie Webb        MRN: [id number]        I have reviewed the current history and physical (which is less than 30 days old) and verified its content. I have reexamined the patient today. There are no significant changes to medical status, history or physical examinations in the 24 hours prior to the procedure.  Pre-operative antibiotics are not indicated. Patient is undergoing a procedure in which antibiotics are not indicated.    Alison Murray, MD       08/13/18  9:41 AM

## 2018-08-13 NOTE — Anesthesia Procedure Notes (Signed)
Intubation  Performed by: Doylene Canning, CRNA  Authorized by: Oleta Mouse, MD     Procedure Details:     Pre-oxygenated: Yes      Induction:  IV    Blade:  Mac 3    View Grade:  1    Tube type:  Oral cuffed    Tube size (mm):  7.0    Tube Secured at (cm):  21    Cricoid pressure: No      Stylet: Yes      Bougie Used: No      Dental damage: No      Lip/Other damage: No      Bite block inserted:  None    ET-CO2 present: Yes      Breath sounds equal: Yes      Eyes taped: Yes

## 2018-08-14 ENCOUNTER — Ambulatory Visit: Payer: Worker's Comp, Other unspecified | Attending: Ophthalmology

## 2018-08-14 DIAGNOSIS — H33052 Total retinal detachment, left eye: Secondary | ICD-10-CM | POA: Insufficient documentation

## 2018-08-14 DIAGNOSIS — S0532XD Ocular laceration without prolapse or loss of intraocular tissue, left eye, subsequent encounter: Secondary | ICD-10-CM | POA: Insufficient documentation

## 2018-08-14 DIAGNOSIS — Z09 Encounter for follow-up examination after completed treatment for conditions other than malignant neoplasm: Secondary | ICD-10-CM | POA: Insufficient documentation

## 2018-08-14 NOTE — Progress Notes (Signed)
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 Vicie Mutters)    #s/p PPV/MP/Retinectomy/EL/AfX/SO & Choroidal Drainage OS (Date 03/26/18)  - Original plan to keep oil for 6 months, however worsening ERM now with TRD today  - Risks, benefits and alternatives including no intervention, observation and treatment options were presented and discussed in detail with the patient for PPV/SOR/MP/AFx/oil OS  The patient elects to proceed with surgery'    Postop: s/p PPV/SOR/MP/EL/SOI (Date 08/13/18 by Riazi/Codey Burling/Clark) for TRD OS  Positioning: Face down  Duration: 7 days    Today: [facility] HM (checked by Dr. Ashley Royalty), IOP acceptable, retina flat under oil, fresh laser at retinectomy edges that were freshened, multiple membranes removed during surgery inferiorly. Preretinal heme sup and inf with faint subretinal heme inf outside of arcade. Recommend face down and observation.     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 I saw and examined the patient and reviewed in detail the findings of the resident/fellow. The final examination findings, image interpretations, and plan as documented in the record represent my personal judgment and conclusions.     Paulla Dolly, MD  Vitreoretinal Surgery Fellow  [place]

## 2018-08-18 ENCOUNTER — Telehealth: Payer: Self-pay

## 2018-08-18 NOTE — Telephone Encounter (Signed)
Patient is calling, states she previously had surgery for a retina detachment. Pt stated she is experiencing similar symptoms. She feels as if her retina is detached again and is having blurry vision. Requesting to see MD tomorrow 08/4. Please give her a call back. (Spanish speaker)

## 2018-08-20 ENCOUNTER — Telehealth: Payer: Self-pay

## 2018-08-20 NOTE — Telephone Encounter (Signed)
Wrong number provided for [facility].

## 2018-08-20 NOTE — Telephone Encounter (Signed)
Spoke to patient she states her eye feels like it did before surgery. Patient is scheduled to follow up with Retina tomorrow morning. Patient requested to keep appointment as scheduled.

## 2018-08-20 NOTE — Telephone Encounter (Signed)
Natalie Webb is requesting a work status report on patient.  Please fax report to [phone]

## 2018-08-20 NOTE — Telephone Encounter (Signed)
Judeen Hammans calling from [employer]   Regarding eye follow up due today by 3est   Please call back [phone]

## 2018-08-21 ENCOUNTER — Ambulatory Visit: Payer: Worker's Comp, Other unspecified | Attending: Ophthalmology

## 2018-08-21 DIAGNOSIS — S0532XD Ocular laceration without prolapse or loss of intraocular tissue, left eye, subsequent encounter: Secondary | ICD-10-CM | POA: Insufficient documentation

## 2018-08-21 DIAGNOSIS — H5462 Unqualified visual loss, left eye, normal vision right eye: Secondary | ICD-10-CM | POA: Insufficient documentation

## 2018-08-21 DIAGNOSIS — H33052 Total retinal detachment, left eye: Secondary | ICD-10-CM | POA: Insufficient documentation

## 2018-08-21 MED ORDER — ACETAMINOPHEN 325 MG PO TABS
650.0000 mg | ORAL_TABLET | ORAL | 3 refills | Status: AC | PRN
Start: 2018-08-21 — End: ?

## 2018-08-21 NOTE — Progress Notes (Signed)
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 Vicie Mutters)    #s/p PPV/MP/Retinectomy/EL/AfX/SO & Choroidal Drainage OS (Date 03/26/18)  - Original plan to keep oil for 6 months, however worsening ERM now with TRD today  - Risks, benefits and alternatives including no intervention, observation and treatment options were presented and discussed in detail with the patient for PPV/SOR/MP/AFx/oil OS  The patient elects to proceed with surgery'    Postop: s/p PPV/SOR/MP/EL/SOI (Date 08/13/18 by Riazi/Millard Bautch/Clark) for TRD OS  Positioning: Face down  Duration: 7 days    Today: POW1 VA HM  IOP acceptable, tractional membrane superiorly outside arcade, macula flat, laser at retinectomy edges, Improving Preretinal heme sup and inf with faint subretinal heme inf outside of arcade. Recommend face down and observation.     Instructions reviewed   - RD precautions  - Return for increasing pain/decreasing vision  - No getting the eye wet, no rubbing the eye, no heavy lifting for 1 month after surgery  Drops:  Vigamox  - 4 - stop  Pred - 4(start 08/14/18) - continue  Cyc -4 - continue  Maxitrol - QHS        Not addressed today    #Headache  - splitting headache since trauma to left eye  - mildly helped with Tylenol  - no clear ocular explanation  Plan: referral to Neurology    #Glaucoma suspect based on C/D ratio  - unknown FHx  - IOP wnl  - OCT nfl full OD  - HVF OD with high total deviation, nonspecific pattern deficits  - 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 I saw and examined the patient and reviewed in detail the findings of the resident/fellow. The final examination findings, image interpretations, and plan as documented in the record represent my personal judgment and conclusions.     Alison Murray, MD  Vitreoretinal Surgery Fellow  [place]

## 2018-08-25 ENCOUNTER — Telehealth: Payer: Self-pay

## 2018-08-25 ENCOUNTER — Encounter: Payer: Self-pay | Admitting: Ophthalmology

## 2018-08-25 ENCOUNTER — Ambulatory Visit: Payer: Worker's Comp, Other unspecified | Attending: Ophthalmology | Admitting: Ophthalmology

## 2018-08-25 DIAGNOSIS — H5462 Unqualified visual loss, left eye, normal vision right eye: Secondary | ICD-10-CM | POA: Insufficient documentation

## 2018-08-25 DIAGNOSIS — H33052 Total retinal detachment, left eye: Secondary | ICD-10-CM | POA: Insufficient documentation

## 2018-08-25 DIAGNOSIS — S0532XD Ocular laceration without prolapse or loss of intraocular tissue, left eye, subsequent encounter: Secondary | ICD-10-CM | POA: Insufficient documentation

## 2018-08-25 NOTE — Progress Notes (Signed)
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 Vicie Mutters)    #s/p PPV/MP/Retinectomy/EL/AfX/SO & Choroidal Drainage OS (Date 03/26/18)  - Original plan to keep oil for 6 months, however worsening ERM now with TRD today  - Risks, benefits and alternatives including no intervention, observation and treatment options were presented and discussed in detail with the patient for PPV/SOR/MP/AFx/oil OS  The patient elects to proceed with surgery'    Postop: s/p PPV/SOR/MP/EL/SOI (Date 08/13/18 by Riazi/Elie Gragert/Clark) for TRD OS  Positioning: Face down  Duration: 7 days    Today: POW2 VA HM  IOP acceptable, macula flat, laser at retinectomy edges, Improving Preretinal/subretinal heme sup and inf with faint subretinal heme inf outside of arcade that is resolving. Recommend face down and observation. Continued face down position    Instructions reviewed   - RD precautions  - Return for increasing pain/decreasing vision  - No getting the eye wet, no rubbing the eye, no heavy lifting for 1 month after surgery  Drops:  Vigamox  - 4 - stop  Pred - 4(start 08/14/18) - continue start 4/3/2/1 taper  Cyc -4 - decr to BID   Maxitrol - QHS        Not addressed today    #Headache  - splitting headache since trauma to left eye  - mildly helped with Tylenol  - no clear ocular explanation  Plan: referral to Neurology    #Glaucoma suspect based on C/D ratio  - unknown FHx  - IOP wnl  - OCT nfl full OD  - HVF OD with high total deviation, nonspecific pattern deficits  - 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 I saw and examined the patient and reviewed in detail the findings of the resident/fellow. The final examination findings, image interpretations, and plan as documented in the record represent my personal judgment and conclusions.     Alison Murray, MD  Vitreoretinal Surgery Fellow  [place]

## 2018-08-25 NOTE — Telephone Encounter (Signed)
Dr Willa Frater called for peer to peer and is due today. Called clinic spoke with [facility] caller connected,. Thanks!

## 2018-08-25 NOTE — Telephone Encounter (Signed)
Dr. Delora Fuel would like Peer to Peer with Dr. Ashley Royalty to verify and recommend authorizations for visit. He would like a call back at [phone] if possible, but did let me know that he has surgery this afternoon and a message would suffice.     Thank you

## 2018-08-26 NOTE — Telephone Encounter (Signed)
Faxed last 2 md notes.

## 2018-08-27 NOTE — Telephone Encounter (Signed)
Alison Murray, MD  Larence Penning, Vonna Kotyk, MD 1 hour ago (11:27 AM)     Hulen Skains and left voicemail for peer to peer    Message text        Larence Penning, Vonna Kotyk, MD; Alison Murray, MD 4 hours ago (8:58 AM)     Can you please contact this doctor for a peer to peer review so they can continue to authorize the patients visits.    Message text

## 2018-08-27 NOTE — Telephone Encounter (Signed)
Natalie Murray, MD  Larence Penning, Vonna Kotyk, MD 1 hour ago (11:27 AM)     Called and left voicemail for peer to peer

## 2018-09-08 ENCOUNTER — Other Ambulatory Visit: Payer: Self-pay

## 2018-09-08 MED ORDER — PREDNISOLONE ACETATE 1 % OP SUSP
1.0000 [drp] | Freq: Every day | OPHTHALMIC | 0 refills | Status: DC
Start: 2018-09-08 — End: 2018-09-12

## 2018-09-08 NOTE — Telephone Encounter (Signed)
*  spanish speaking*Pt calling states will be completely out of eye drops after today. Transferred to [place] office to hold for tech assistance.

## 2018-09-12 ENCOUNTER — Ambulatory Visit: Payer: Worker's Comp, Other unspecified | Attending: Ophthalmology

## 2018-09-12 DIAGNOSIS — H538 Other visual disturbances: Secondary | ICD-10-CM | POA: Insufficient documentation

## 2018-09-12 DIAGNOSIS — S0532XA Ocular laceration without prolapse or loss of intraocular tissue, left eye, initial encounter: Secondary | ICD-10-CM | POA: Insufficient documentation

## 2018-09-12 DIAGNOSIS — S0532XD Ocular laceration without prolapse or loss of intraocular tissue, left eye, subsequent encounter: Secondary | ICD-10-CM | POA: Insufficient documentation

## 2018-09-12 DIAGNOSIS — H5462 Unqualified visual loss, left eye, normal vision right eye: Secondary | ICD-10-CM | POA: Insufficient documentation

## 2018-09-12 DIAGNOSIS — H33052 Total retinal detachment, left eye: Secondary | ICD-10-CM | POA: Insufficient documentation

## 2018-09-12 DIAGNOSIS — H5712 Ocular pain, left eye: Secondary | ICD-10-CM | POA: Insufficient documentation

## 2018-09-12 MED ORDER — CYCLOPENTOLATE HCL 1 % OP SOLN
1.0000 [drp] | Freq: Two times a day (BID) | OPHTHALMIC | 0 refills | Status: DC
Start: 2018-09-12 — End: 2019-07-09

## 2018-09-12 MED ORDER — PREDNISOLONE ACETATE 1 % OP SUSP
1.0000 [drp] | Freq: Two times a day (BID) | OPHTHALMIC | 1 refills | Status: DC
Start: 2018-09-12 — End: 2018-11-20

## 2018-09-12 NOTE — Progress Notes (Signed)
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 Vicie Mutters)    #s/p PPV/MP/Retinectomy/EL/AfX/SO & Choroidal Drainage OS (Date 03/26/18)  - Original plan to keep oil for 6 months, however worsening ERM now with TRD today  - Risks, benefits and alternatives including no intervention, observation and treatment options were presented and discussed in detail with the patient for PPV/SOR/MP/AFx/oil OS  The patient elects to proceed with surgery'    Postop: s/p PPV/SOR/MP/EL/SOI (Date 08/13/18 by Riazi/Puri/Clark) for TRD OS  Positioning: Face down  Duration: 7 days    09/12/18: C/o ocular irritation and tearing OS. Advised PFATs and WC. 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 If the vision improves, the XT will improve. For now, will hold off on strabismus surgery until patient is fully healed.    Instructions reviewed   - RD precautions  - Return for increasing pain/decreasing vision  - No getting the eye wet, no rubbing the eye, no heavy lifting for 1 month after surgery  Drops:  Vigamox  - 4 - stop  Pred - 4(start 08/14/18) - continue start 4/3/2   Cyc -4 - decr to BID   Maxitrol - QHS  Start PFATs q2h     __________________________________________________________  Not addressed today    #Headache  - splitting headache since trauma to left eye  - mildly helped with Tylenol  - no clear ocular explanation  Plan: referral to Neurology    #Glaucoma suspect based on C/D ratio  - unknown FHx  - IOP wnl  - OCT nfl full OD  - HVF OD with high total deviation, nonspecific pattern deficits  - Possible Nevus of Ota like  syndrome given periorbital melanocytosis apperance      RTC:  - Retina Friday 10/10/18 DFE/OCTm/Optos OS only   - Glaucoma following resolution of above surgical issues    Azzie Roup MD  Resident Physician PGY-2  Department of Ophthalmology    Staffed with Dr. Gregary Cromer who agrees with above assessment and plan     History obtained with help of Spanish interpretor [phone]    I reviewed and confirmed all history components, including the HPI; and any other elements performed by the technician. I performed the key and critical portions of the exam. The final examination findings, image interpretations, and plan as documented in the record represent my personal judgement and conclusion.     Leola Brazil MD.

## 2018-09-15 ENCOUNTER — Telehealth: Payer: Self-pay

## 2018-09-15 NOTE — Telephone Encounter (Signed)
Spanish Speaker    Patient called in with questions about the eye drops. She is requesting for the office to advise if it is okay for her to use her old prescription until the insurance approves the new one. Please assist thank you.

## 2018-09-16 NOTE — Telephone Encounter (Signed)
No, can stop all drops except for her taper of PF, decrease by one drop a day every week

## 2018-09-16 NOTE — Telephone Encounter (Signed)
Drops:  Vigamox  - 4 - stop  Pred - 4(start 08/14/18) - continue start 4/3/2   Cyc -4 - decr to BID   Maxitrol - QHS  Start PFATs q2h

## 2018-09-16 NOTE — Telephone Encounter (Signed)
Spoke to patient, she's all out of eye drops..     Advice her to use     Drops:  Vigamox  - 4 - stop  Pred - 4(start 08/14/18) - continue start 4/3/2   Cyc -4 - decr to BID   Maxitrol - QHS  Start PFATs q2h

## 2018-09-17 NOTE — Telephone Encounter (Signed)
Spoke to patient gave her MD's recommendations      Valentina Gu, MD  You 22 hours ago (5:49 PM)        No, can stop all drops except for her taper of PF, decrease by one drop a day every week

## 2018-10-10 ENCOUNTER — Ambulatory Visit: Payer: Worker's Comp, Other unspecified | Attending: Ophthalmology

## 2018-10-10 DIAGNOSIS — S0532XD Ocular laceration without prolapse or loss of intraocular tissue, left eye, subsequent encounter: Secondary | ICD-10-CM | POA: Insufficient documentation

## 2018-10-10 DIAGNOSIS — H33052 Total retinal detachment, left eye: Secondary | ICD-10-CM | POA: Insufficient documentation

## 2018-10-10 DIAGNOSIS — S0532XA Ocular laceration without prolapse or loss of intraocular tissue, left eye, initial encounter: Secondary | ICD-10-CM | POA: Insufficient documentation

## 2018-10-10 NOTE — Progress Notes (Signed)
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 Vicie Mutters)    #s/p PPV/MP/Retinectomy/EL/AfX/SO & Choroidal Drainage OS (Date 03/26/18)  - Original plan to keep oil for 6 months, however worsening ERM now with TRD     Postop: s/p PPV/SOR/MP/EL/SOI (Date 08/13/18 by Riazi/Shamere Dilworth/Clark) for TRD OS  Positioning: Face down  Duration: 7 days     C/o ocular irritation and tearing OS. Advised PFATs and WC. 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 If the vision improves, the XT will improve. For now, will hold off on strabismus surgery until patient is fully healed.    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 I saw and examined the patient and reviewed in detail the findings of the resident/fellow. The final examination findings, image interpretations, and plan as documented in the record represent my personal judgment and conclusions.     Natalie Murray, MD  Vitreoretinal Surgery Fellow  [place]

## 2018-10-16 ENCOUNTER — Encounter: Payer: Self-pay | Admitting: Ophthalmology

## 2018-10-16 DIAGNOSIS — F32A Depression, unspecified: Secondary | ICD-10-CM

## 2018-11-12 ENCOUNTER — Telehealth: Payer: Self-pay

## 2018-11-12 NOTE — Telephone Encounter (Signed)
Dr. Merian Capron is requesting a call from Dr. Ashley Royalty regarding a pre Josem Kaufmann

## 2018-11-13 NOTE — Telephone Encounter (Signed)
Dr Doroteo Glassman is following up on yesterday call regarding a pre authorization. Called clinic and transfer caller, Cristie Hem was going to page Dr Ashley Royalty.

## 2018-11-17 NOTE — Telephone Encounter (Signed)
Per Cristie Hem, Dr. Ashley Royalty spoke with Dr. Merian Capron.

## 2018-11-19 ENCOUNTER — Telehealth: Payer: Self-pay

## 2018-11-19 NOTE — Telephone Encounter (Signed)
Transferred patient to Clinic to speak with technician patient recently had retina surgery and stated she feels her retina moving and she is complaining of headaches.  Transferred per Cristie Hem.  Thank you

## 2018-11-20 ENCOUNTER — Ambulatory Visit: Payer: Worker's Comp, Other unspecified | Attending: Retina Specialist

## 2018-11-20 DIAGNOSIS — S0532XA Ocular laceration without prolapse or loss of intraocular tissue, left eye, initial encounter: Secondary | ICD-10-CM | POA: Insufficient documentation

## 2018-11-20 DIAGNOSIS — H33052 Total retinal detachment, left eye: Secondary | ICD-10-CM | POA: Insufficient documentation

## 2018-11-20 MED ORDER — PREDNISOLONE ACETATE 1 % OP SUSP
1.0000 [drp] | Freq: Four times a day (QID) | OPHTHALMIC | 1 refills | Status: DC
Start: 2018-11-20 — End: 2019-01-29

## 2018-11-20 NOTE — Progress Notes (Signed)
11/20/18  Vision Symptoms Exam OCT   20/25 stable  CF-HM Pain OS x10 days Stable, attached, resolving heme still Flat, no fluid    Plan: 2+ cell seen on exam today with associated pain. 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 Vicie Mutters)    #s/p PPV/MP/Retinectomy/EL/AfX/SO & Choroidal Drainage OS (Date 03/26/18)  - Original plan to keep oil for 6 months, however worsening ERM now with TRD     Postop: s/p PPV/SOR/MP/EL/SOI (Date 08/13/18 by Riazi/Puri/Clark) for TRD OS  Positioning: Face down  Duration: 7 days     C/o ocular irritation and tearing OS. Advised PFATs and WC. 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 If the vision improves, the XT will improve. For now, will hold off on strabismus surgery until patient is fully healed.    Instructions reviewed   - RD precautions  - Return for increasing pain/decreasing vision  - No getting the eye wet, no rubbing the eye, no heavy lifting for 1 month after surgery  Drops:  PFATs q2h   Restart PF QID OS    __________________________________________________________  Not addressed today    #Headache  - splitting headache since trauma to left eye  - mildly helped with Tylenol  - no clear ocular explanation  Plan: referral to Neurology    #Glaucoma suspect based on C/D ratio  - unknown FHx  - IOP wnl  - OCT nfl  full OD  - HVF OD with high total deviation, nonspecific pattern deficits  - 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 Laurena Spies

## 2018-11-20 NOTE — Telephone Encounter (Signed)
Spoke to patient and scheduled her urgent appointment.

## 2018-11-21 NOTE — Progress Notes (Signed)
11/28/18  Vision Symptoms Exam OCT   20/25 stable  CF-HM stable Pain OS much improved Stable, attached, resolving heme still Flat, no fluid    Plan: tr cell seen on exam today with improved pain.  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 Vicie Mutters)    #s/p PPV/MP/Retinectomy/EL/AfX/SO & Choroidal Drainage OS (Date 03/26/18)  - Original plan to keep oil for 6 months, however worsening ERM now with TRD     Postop: s/p PPV/SOR/MP/EL/SOI (Date 08/13/18 by Riazi/Puri/Clark) for TRD OS    __________________________________________________________  Not addressed today    #Headache  - splitting headache since trauma to left eye  - mildly helped with Tylenol  - no clear ocular explanation  Plan: referral to Neurology    #Glaucoma suspect based on C/D ratio  - unknown FHx  - IOP wnl  - OCT nfl full OD  - HVF OD with high total deviation, nonspecific pattern deficits  - 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 Ashley Royalty, who agrees with the above assessment and plan.

## 2018-11-28 ENCOUNTER — Ambulatory Visit: Payer: Worker's Comp, Other unspecified

## 2018-11-28 ENCOUNTER — Ambulatory Visit: Payer: Worker's Comp, Other unspecified | Attending: Ophthalmology

## 2018-11-28 DIAGNOSIS — H33052 Total retinal detachment, left eye: Secondary | ICD-10-CM | POA: Insufficient documentation

## 2018-11-28 DIAGNOSIS — S0532XD Ocular laceration without prolapse or loss of intraocular tissue, left eye, subsequent encounter: Secondary | ICD-10-CM | POA: Insufficient documentation

## 2018-11-28 DIAGNOSIS — H5462 Unqualified visual loss, left eye, normal vision right eye: Secondary | ICD-10-CM | POA: Insufficient documentation

## 2018-12-03 ENCOUNTER — Telehealth: Payer: Self-pay | Admitting: Retina Specialist

## 2018-12-03 NOTE — Telephone Encounter (Signed)
Patient states the prescription needs to be sent to her [facility] in order for it to be covered

## 2018-12-05 NOTE — Telephone Encounter (Signed)
Faxed RFA today.

## 2018-12-10 ENCOUNTER — Telehealth: Payer: Self-pay

## 2018-12-10 NOTE — Telephone Encounter (Signed)
2nd call from 11/18: Patient following up states she has not received a response if medication will be covered, Patient is out of eye drops. Please call back, thank you.    *pt speaks spanish

## 2018-12-15 NOTE — Telephone Encounter (Signed)
Left message for the patient letting her know that we have faxed over the form already

## 2018-12-15 NOTE — Telephone Encounter (Signed)
RFA sent on 11/20 to her workers comp ins.

## 2018-12-16 ENCOUNTER — Telehealth: Payer: Self-pay

## 2018-12-16 NOTE — Telephone Encounter (Signed)
Narda Rutherford calling from [employer], wanted to provide reviewing doctor's info: Dr.Draine [phone]. States can call to provide any additional info not in the notes.  Thank you.

## 2019-01-06 ENCOUNTER — Telehealth: Payer: Self-pay | Admitting: Ophthalmology

## 2019-01-06 NOTE — Telephone Encounter (Signed)
PA Aapproved   Prednisolone Acetate

## 2019-01-29 ENCOUNTER — Encounter: Payer: Self-pay | Admitting: Retina Specialist

## 2019-01-29 ENCOUNTER — Ambulatory Visit: Payer: Worker's Comp, Other unspecified | Attending: Retina Specialist | Admitting: Retina Specialist

## 2019-01-29 DIAGNOSIS — S0532XD Ocular laceration without prolapse or loss of intraocular tissue, left eye, subsequent encounter: Secondary | ICD-10-CM | POA: Insufficient documentation

## 2019-01-29 DIAGNOSIS — H5712 Ocular pain, left eye: Secondary | ICD-10-CM | POA: Insufficient documentation

## 2019-01-29 DIAGNOSIS — H33052 Total retinal detachment, left eye: Secondary | ICD-10-CM | POA: Insufficient documentation

## 2019-01-29 DIAGNOSIS — S0532XA Ocular laceration without prolapse or loss of intraocular tissue, left eye, initial encounter: Secondary | ICD-10-CM

## 2019-01-29 MED ORDER — PREDNISOLONE ACETATE 1 % OP SUSP
1.0000 [drp] | Freq: Two times a day (BID) | OPHTHALMIC | 2 refills | Status: DC
Start: 2019-01-29 — End: 2019-05-29

## 2019-01-29 NOTE — Progress Notes (Signed)
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 Erroll Luna)    #s/p PPV/MP/Retinectomy/EL/AfX/SO & Choroidal Drainage OS (Date 03/26/18)  - Original plan to keep oil for 6 months, however worsening ERM now with TRD     Postop: s/p PPV/SOR/MP/EL/SOI (Date 08/13/18 by Riazi/Puri/Clark) for TRD OS    Today: 01/29/19, OS: XT and ptosis, HM, apakia, aniridia, SO filling vitr cavity, 360 retinectomy, retina flat with some translocation of macula towards inferior, mild ERM, no heme, IOP 7 mmHg  Plan: Observe, Pred forte once daily    #Headache  - splitting headache since trauma to left eye  - mildly helped with Tylenol  - no clear ocular explanation  Plan: referral to Neurology    #Glaucoma suspect based on C/D ratio  - unknown FHx  - IOP wnl  - OCT nfl full OD  - HVF OD with high total deviation, nonspecific pattern deficits  - 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 epithelial downgrowth nasally?    Anterior Chamber Deep and quiet tr cell/pigment    Iris Round, no rubeosis no visible iris tissue    Lens Clear aphakia    Vitreous Clear SO          Fundus Exam       Right Left    Disc Healthy contour and size perfused    C/D Ratio 0.7 0.3      Macula Normal contour and reflex for age Flat, no heme, faint ERM, atrophy    Vessels Normal Normal    Periphery Flat and attached 360 degrees 360 retinectomy w laser, preretinal heme sup and inf but outside of retina signifcant improved, membrane superiorly with trace traction but no change in terms of detachment or breaks                  - See Assesment/plan above    Follow-up plan:  Riazi  OCTm FA ICG FAF OCTa OCTrnfl Bscan Other  Injection Laser   2 Months  ou               I reviewed and confirmed all history components, including the HPI; and any other elements performed by the technician. I performed the key and critical portions of the exam. The final examination findings, image interpretations, and plan as documented in the record represent my personal judgement and conclusion.     Dorann Lodge MD.

## 2019-01-30 ENCOUNTER — Telehealth: Payer: Self-pay | Admitting: Ophthalmology

## 2019-01-30 ENCOUNTER — Telehealth: Payer: Self-pay | Admitting: Retina Specialist

## 2019-01-30 NOTE — Telephone Encounter (Signed)
Patient states Pred Forte 1% is not covered by insurance and needs PA please advise.

## 2019-01-30 NOTE — Telephone Encounter (Signed)
Called [phone]  No answer left voicemail to give our office a call to further assist

## 2019-01-30 NOTE — Telephone Encounter (Signed)
dr Onalee Hua [facility] [phone]      Calling to speak to dr Randolm Idol or a tech- needs to speak to someone before he approves the follow up visit for dr Randolm Idol

## 2019-02-04 NOTE — Telephone Encounter (Signed)
PA SUBMITTED TODAY

## 2019-02-04 NOTE — Telephone Encounter (Signed)
Follow up authorization was approved.

## 2019-02-06 NOTE — Telephone Encounter (Signed)
Spoke to Graciela ph. [phone] just want to make sure we did submit RFA to [facility] for PF

## 2019-02-06 NOTE — Telephone Encounter (Signed)
Patient states her attorney is requesting a call please ask for Graciela ph. [phone]

## 2019-02-12 ENCOUNTER — Telehealth: Payer: Self-pay

## 2019-02-12 NOTE — Telephone Encounter (Signed)
PA APPROVED FOR PF

## 2019-02-12 NOTE — Telephone Encounter (Signed)
predforte was approved 02/10/2019-09/10/2019 workers comp

## 2019-04-09 ENCOUNTER — Encounter: Payer: Self-pay | Admitting: Retina Specialist

## 2019-04-09 ENCOUNTER — Ambulatory Visit: Payer: Worker's Comp, Other unspecified | Attending: Retina Specialist | Admitting: Retina Specialist

## 2019-04-09 DIAGNOSIS — S0532XA Ocular laceration without prolapse or loss of intraocular tissue, left eye, initial encounter: Secondary | ICD-10-CM | POA: Insufficient documentation

## 2019-04-09 DIAGNOSIS — H33052 Total retinal detachment, left eye: Secondary | ICD-10-CM | POA: Insufficient documentation

## 2019-04-09 DIAGNOSIS — S0532XD Ocular laceration without prolapse or loss of intraocular tissue, left eye, subsequent encounter: Secondary | ICD-10-CM | POA: Insufficient documentation

## 2019-04-09 NOTE — Progress Notes (Signed)
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 Erroll Luna)    #s/p PPV/MP/Retinectomy/EL/AfX/SO & Choroidal Drainage OS (Date 03/26/18)  - Original plan to keep oil for 6 months, however worsening ERM now with TRD     Postop: s/p PPV/SOR/MP/EL/SOI (Date 08/13/18 by Riazi/Puri/Clark) for TRD OS    Today: 01/29/19, OS: XT and ptosis, HM, apakia, aniridia, SO not emulsified, 360 retinectomy, retina flat with some translocation of macula towards inferior same as before, mild ERM, no heme, IOP 6 mmHg  Plan: Observe, Pred forte increasing to BID    #Headache  - splitting headache since trauma to left eye  - mildly helped with Tylenol  - no clear ocular explanation  Plan: referral to Neurology    #Glaucoma suspect based on C/D ratio  - unknown FHx  - IOP wnl  - OCT nfl full OD  - HVF OD with high total deviation, nonspecific pattern deficits  - 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 epithelial downgrowth nasally?    Anterior Chamber Deep and quiet tr cell/pigment    Iris Round, no rubeosis no visible iris tissue    Lens Clear aphakia    Vitreous Clear SO not emulsified          Fundus Exam       Right Left    Disc Healthy contour and  size perfused    C/D Ratio 0.7 0.3    Macula Normal contour and reflex for age Flat, no heme, faint ERM, atrophy, no CME, macular ectopia inferiorly    Vessels Normal Normal    Periphery Flat and attached 360 degrees 360 retinectomy w laser, preretinal heme sup and inf but outside of retina signifcant improved, membrane superiorly with trace traction but no change in terms of detachment or breaks                  - See Assesment/plan above    Follow-up plan:  Riazi  OCTm Fundus photo FA ICG FAF OCT  RNFL Bscan Other  Injection Laser   3 Months  ou                 I reviewed and confirmed all history components, including the HPI; and any other elements performed by the technician. I performed the key and critical portions of the exam. The final examination findings, image interpretations, and plan as documented in the record represent my personal judgement and conclusion.     Dorann Lodge MD.

## 2019-04-29 ENCOUNTER — Telehealth: Payer: Self-pay | Admitting: Retina Specialist

## 2019-04-29 NOTE — Telephone Encounter (Signed)
Patient request last ov notes 03/25 to be mailed to her home address please assist

## 2019-04-30 NOTE — Telephone Encounter (Signed)
MAILED OUT THE MD NOTE TO ADDRESS ON FILE

## 2019-05-29 ENCOUNTER — Other Ambulatory Visit: Payer: Self-pay | Admitting: Retina Specialist

## 2019-05-29 MED ORDER — PREDNISOLONE ACETATE 1 % OP SUSP
1.0000 [drp] | Freq: Two times a day (BID) | OPHTHALMIC | 2 refills | Status: DC
Start: 2019-05-29 — End: 2019-07-09

## 2019-05-29 NOTE — Telephone Encounter (Signed)
Pt needs refill for medication with the pink cap thinks its prednisolone acetate.   Pt a lot out of it and also sates that a PA needs to be submitted.

## 2019-07-07 ENCOUNTER — Telehealth: Payer: Self-pay | Admitting: Retina Specialist

## 2019-07-07 NOTE — Telephone Encounter (Addendum)
Dr. Vonna Drafts is calling to advise procedures are approved thank you

## 2019-07-08 NOTE — Telephone Encounter (Signed)
CAU CHECKED AUTH IS STILL PENDING

## 2019-07-09 ENCOUNTER — Ambulatory Visit: Payer: Worker's Comp, Other unspecified | Attending: Retina Specialist | Admitting: Retina Specialist

## 2019-07-09 ENCOUNTER — Encounter: Payer: Self-pay | Admitting: Retina Specialist

## 2019-07-09 DIAGNOSIS — H33052 Total retinal detachment, left eye: Secondary | ICD-10-CM | POA: Insufficient documentation

## 2019-07-09 DIAGNOSIS — S0532XA Ocular laceration without prolapse or loss of intraocular tissue, left eye, initial encounter: Secondary | ICD-10-CM | POA: Insufficient documentation

## 2019-07-09 DIAGNOSIS — S0532XD Ocular laceration without prolapse or loss of intraocular tissue, left eye, subsequent encounter: Secondary | ICD-10-CM | POA: Insufficient documentation

## 2019-07-09 DIAGNOSIS — H5462 Unqualified visual loss, left eye, normal vision right eye: Secondary | ICD-10-CM | POA: Insufficient documentation

## 2019-07-09 MED ORDER — PREDNISOLONE ACETATE 1 % OP SUSP
1.0000 [drp] | Freq: Four times a day (QID) | OPHTHALMIC | 3 refills | Status: DC
Start: 2019-07-09 — End: 2019-09-22

## 2019-07-09 NOTE — Progress Notes (Signed)
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 Erroll Luna)    #s/p PPV/MP/Retinectomy/EL/AfX/SO & Choroidal Drainage OS (Date 03/26/18)  - Original plan to keep oil for 6 months, however worsening ERM now with TRD     # s/p PPV/SOR/MP/EL/SOI (Date 08/13/18 by Riazi/Puri/Clark) for TRD OS     OS: XT and ptosis, HM, apakia, aniridia, SO not emulsified, 360 retinectomy, retina flat with some translocation of macula towards inferior and atrophy same as before, mild ERM, no heme, IOP 6 mmHg  Plan: Observe,     Increasing Pred forte increasing to QID    #Headache  - splitting headache since trauma to left eye  - mildly helped with Tylenol  - no clear ocular explanation  Already referred to Neurology  Plan: Observe     #Glaucoma suspect based on C/D ratio  - unknown FHx  - IOP WNL in OD  - OCT nfl full OD  - HVF OD with high total deviation, nonspecific pattern deficits  - 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 epithelial downgrowth nasally?    Anterior Chamber Deep and quiet tr cell/pigment    Iris Round, no rubeosis no visible iris tissue    Lens Clear aphakia    Vitreous Clear SO not emulsified          Fundus Exam       Right Left     Disc Healthy contour and size perfused    C/D Ratio 0.7 0.3    Macula Normal contour and reflex for age Flat, no heme, faint ERM, atrophy, no CME, atrophic retina, macular ectopia inferiorly    Vessels Normal Normal    Periphery Flat and attached 360 degrees 360 retinectomy w laser, preretinal heme sup and inf but outside of retina signifcant improved, membrane superiorly with trace traction but no change in terms of detachment or breaks                  - See Assesment/plan above    Follow-up plan:  Riazi  OCTm Fundus photo FA ICG FAF OCT  RNFL Bscan Other  Injection Laser   3 Months  ou                 I reviewed and confirmed all history components, including the HPI; and any other elements performed by the technician. I performed the key and critical portions of the exam. The final examination findings, image interpretations, and plan as documented in the record represent my personal judgement and conclusion.     Dorann Lodge MD.

## 2019-07-29 ENCOUNTER — Telehealth: Payer: Self-pay | Admitting: Retina Specialist

## 2019-09-22 ENCOUNTER — Telehealth: Payer: Self-pay | Admitting: Retina Specialist

## 2019-09-22 DIAGNOSIS — H33052 Total retinal detachment, left eye: Secondary | ICD-10-CM

## 2019-09-22 NOTE — Telephone Encounter (Signed)
Patient called and stated they are requesting a refill for prednisoLONE acetate (PRED FORTE) 1 % ophthalmic suspension. Stated she is out of her drops and is requesting to reach out to insurance so they can ok it with the pharmacy.  Please assist.

## 2019-09-22 NOTE — Telephone Encounter (Signed)
MD Notes:  Increasing Pred forte increasing to QID

## 2019-09-24 MED ORDER — PREDNISOLONE ACETATE 1 % OP SUSP
1.0000 [drp] | Freq: Four times a day (QID) | OPHTHALMIC | 3 refills | Status: DC
Start: 2019-09-24 — End: 2019-10-08

## 2019-09-24 NOTE — Telephone Encounter (Signed)
Signed orders for PF to QID

## 2019-09-28 NOTE — Telephone Encounter (Signed)
Sampson Goon, MD  You 4 days ago     MS       Signed orders for PF to QID         Documentation

## 2019-09-29 ENCOUNTER — Telehealth: Payer: Self-pay | Admitting: Retina Specialist

## 2019-09-29 NOTE — Telephone Encounter (Signed)
Pt requesting a call back from clinic to discuss status on Prednisolone eyedrops.  Please contact pt back.    Thank you.

## 2019-10-01 NOTE — Telephone Encounter (Signed)
PA SUBMITTED FOR PF

## 2019-10-05 ENCOUNTER — Telehealth: Payer: Self-pay | Admitting: Retina Specialist

## 2019-10-05 NOTE — Telephone Encounter (Signed)
Patient calling to follow up on request for prior auth for eyedrops, per patient has been waiting and this eyedrops help with eye pain and lubricate. Please contact patient to assist regarding request, Thank you.

## 2019-10-05 NOTE — Telephone Encounter (Signed)
Spoke to agent at [facility] office they did not receive our PA request, she provided new number refaxed.   She stated that by tomorrow we should have a response on the approval.

## 2019-10-08 ENCOUNTER — Encounter: Payer: Self-pay | Admitting: Retina Specialist

## 2019-10-08 ENCOUNTER — Ambulatory Visit: Payer: Worker's Comp, Other unspecified | Attending: Retina Specialist | Admitting: Retina Specialist

## 2019-10-08 DIAGNOSIS — S0532XD Ocular laceration without prolapse or loss of intraocular tissue, left eye, subsequent encounter: Secondary | ICD-10-CM | POA: Insufficient documentation

## 2019-10-08 DIAGNOSIS — S0532XA Ocular laceration without prolapse or loss of intraocular tissue, left eye, initial encounter: Secondary | ICD-10-CM | POA: Insufficient documentation

## 2019-10-08 DIAGNOSIS — H33052 Total retinal detachment, left eye: Secondary | ICD-10-CM | POA: Insufficient documentation

## 2019-10-08 DIAGNOSIS — H538 Other visual disturbances: Secondary | ICD-10-CM | POA: Insufficient documentation

## 2019-10-08 DIAGNOSIS — H02402 Unspecified ptosis of left eyelid: Secondary | ICD-10-CM | POA: Insufficient documentation

## 2019-10-08 MED ORDER — PREDNISOLONE ACETATE 1 % OP SUSP
1.0000 [drp] | Freq: Three times a day (TID) | OPHTHALMIC | 3 refills | Status: DC
Start: 2019-10-08 — End: 2019-12-07

## 2019-10-08 NOTE — Addendum Note (Signed)
Addended by: Lavinia Sharps, Gwenyth Bouillon on: 10/08/2019 08:55 AM     Modules accepted: Orders

## 2019-10-08 NOTE — Progress Notes (Signed)
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 Erroll Luna)    #s/p PPV/MP/Retinectomy/EL/AfX/SO & Choroidal Drainage OS (Date 03/26/18)  - Original plan to keep oil for 6 months, however worsening ERM now with TRD     # s/p PPV/SOR/MP/EL/SOI (Date 08/13/18 by Riazi/Puri/Clark) for TRD OS  OD: 20/20, VMA, good foveal contour, no RT/RD  Observe       OS: XT and ptosis, HM, apakia, aniridia, SO not emulsified, 360 retinectomy, retina flat with some translocation of macula towards inferior and atrophy same as before, mild ERM, no heme, IOP 7 mmHg  Plan: Observe,     Cont Pred forte TID    #Headache  - splitting headache since trauma to left eye  - mildly helped with Tylenol  - no clear ocular explanation  Already referred to Neurology  Plan: Observe     #Glaucoma suspect based on C/D ratio  - unknown FHx  - IOP WNL in OD  - OCT nfl full OD  - HVF OD with high total deviation, nonspecific pattern deficits  - 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 epithelial downgrowth nasally?    Anterior Chamber Deep and quiet tr cell/pigment    Iris Round, no  rubeosis no visible iris tissue    Lens Clear aphakia    Vitreous Clear SO not emulsified          Fundus Exam       Right Left    Disc Healthy contour and size perfused    C/D Ratio 0.7 0.3    Macula Normal contour and reflex for age Flat, no heme, faint ERM, atrophy, no CME, atrophic retina, macular ectopia inferiorly, fibrosis AT THE RETINECTOMY SITE    Vessels Normal Normal    Periphery Flat and attached 360 degrees 360 retinectomy w laser, preretinal heme sup and inf but outside of retina signifcant improved, membrane superiorly with trace traction but no change in terms of detachment or breaks                  - See Assesment/plan above    Follow-up plan:  Riazi  OCTm Fundus photo FA ICG FAF OCT  RNFL Bscan Other  Injection Laser   4 Months  ou                 I reviewed and confirmed all history components, including the HPI; and any other elements performed by the technician. I performed the key and critical  portions of the exam. The final examination findings, image interpretations, and plan as documented in the record represent my personal judgement and conclusion.     Leola Brazil MD.

## 2019-10-12 ENCOUNTER — Telehealth: Payer: Self-pay | Admitting: Retina Specialist

## 2019-10-12 NOTE — Telephone Encounter (Signed)
Patient is requesting for pre-cert for prednisoLONE acetate (PRED FORTE) 1 % ophthalmic suspension to be be sent to insurance. Patient is out of medication.

## 2019-10-13 NOTE — Telephone Encounter (Signed)
PA SENT TODAY VIA FAXED FOR PF

## 2019-10-14 NOTE — Telephone Encounter (Signed)
SPOKE TO PATIENT WE HAVE SENT THE PA 3 TIMES SINCE 09/13  - CLARA CALLED TO CHECK STATUS AND THEY STATE THEY'VE NOT RECEIVED IT - CLARA REFAXED IT 9/20 I REFAXED IT 9/28

## 2019-10-14 NOTE — Telephone Encounter (Signed)
Patient states she was told today her insurance has not received PA yet. Advised per previous notes it was faxed yesterday. Patient requesting to be faxed again and needs it urgently she is completley out of medication. Patient also mentions she is experiencing severe ey pain. transferred to [facility], will get a tech to assist.

## 2019-10-16 NOTE — Telephone Encounter (Signed)
Patient came in Thursday and took copies of the requests that we have sent to show her attorney. They will be contacting WC to see why they are not approving her eyedrops

## 2019-10-21 ENCOUNTER — Ambulatory Visit: Payer: Worker's Comp, Other unspecified | Attending: Ophthalmology | Admitting: Ophthalmology

## 2019-10-21 ENCOUNTER — Encounter: Payer: Self-pay | Admitting: Ophthalmology

## 2019-10-21 DIAGNOSIS — H02402 Unspecified ptosis of left eyelid: Secondary | ICD-10-CM

## 2019-10-21 MED ORDER — CARBOXYMETHYLCELLULOSE SOD PF 0.5 % OP SOLN
1.0000 [drp] | Freq: Four times a day (QID) | OPHTHALMIC | 11 refills | Status: DC
Start: 2019-10-21 — End: 2021-11-15

## 2019-10-21 NOTE — Progress Notes (Signed)
See "Detailed Report" under my note in the Encounter tab for more details regarding today's visit and eye exam.    I reviewed and confirmed the techs HPI, review of systems, past histories, and readings.    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 She is taking PF TID OS - which helps with eye pain. Patient reports developing a droopy lid after her second retina surgery. She complains the droopiness affects her ability to see her superior visual field.    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), # c/b closed funnel RD 03/20/2018   # s/p PPV/MP/retinectomy/EL/Af/SO + choroidal drainage OS 03/26/2018  # s/p PPV/SOR/MP/EL/SOI 08/13/18 for TROD OS  # monocular precautions  - per retina    # LUL ptosis  - h/o prior trauma and multiple prior surgeries  - poor response to phenyl gtts OS: -0.5 mm OS to 0 mm OS, neg Hering's OD  - CVF >30%, MRD1 -0.5 mm OS, affects ADLs as above  - t/c LELA  - The risks, benefits, and alternatives to the surgery including (but not limited to) bleeding, infection, pain, damage to surrounding structures, poor cosmesis, scarring, asymmetry,  remote risk of vision loss and need for further procedure were discussed.  The patient verbalized understanding and all questions were answered and patient elected to proceed with surgery by signing consent. The patient understands there is no warranty or guarantee to the surgery.  - patient advised RUL may droop in the future  - #[id number] Spanish     To OR    Return and warning precautions discussed.      SIGNED: Nadean Corwin, MD

## 2019-10-26 ENCOUNTER — Telehealth: Payer: Self-pay | Admitting: Retina Specialist

## 2019-10-26 NOTE — Telephone Encounter (Signed)
Patient is scheduled for surgery on 12/09/19 however she is confused that her surgery was supposed to be scheduled on 12/24/19.     Please assist and thank you.

## 2019-10-28 NOTE — Telephone Encounter (Signed)
Patient is following up on previous message. Please call patient back to further assist.    Thank you

## 2019-10-29 NOTE — Telephone Encounter (Signed)
SPOKE WITH PATIENT, RESCHEDULED TO 12/09

## 2019-11-02 ENCOUNTER — Telehealth: Payer: Self-pay

## 2019-11-02 NOTE — Telephone Encounter (Signed)
Natalie Webb is asking for a call back from a nurse to speak to them about getting the pts eyedrops taken care of, she says they need RSA to approve the medication, if its just sent to pharmacy without that first then the medication wont be approved.  Please follow up and assist  Thank you

## 2019-11-02 NOTE — Telephone Encounter (Signed)
Called pharmacy pt picked up Predforte today 11/02/19 with co-pay of $30

## 2019-11-09 NOTE — Telephone Encounter (Signed)
refaxed the RFA to the number provided by Elease Hashimoto at [facility]

## 2019-11-10 ENCOUNTER — Telehealth: Payer: Self-pay | Admitting: Retina Specialist

## 2019-11-10 NOTE — Telephone Encounter (Signed)
RFA was received by [facility] [employer] for Pred Forte, medical records/notes are needed for the request for medication     Please fax to [phone] request per [facility]

## 2019-11-11 NOTE — Telephone Encounter (Signed)
Jonah following up on request to verify if message was sent. Advise message pending response. If we can send medical records/notes to [phone]. Thank you

## 2019-11-12 ENCOUNTER — Telehealth: Payer: Self-pay | Admitting: Retina Specialist

## 2019-11-12 NOTE — Telephone Encounter (Signed)
Peer to peer, please call Dr. Vonna Drafts by 3pm central time 01/13/27

## 2019-11-12 NOTE — Telephone Encounter (Signed)
Dr. Vonna Drafts is calling to follow up states it very important for Dr. Lavell Islam to call him

## 2019-11-12 NOTE — Telephone Encounter (Signed)
Chrisandra Netters, MD  You; Royston Sinner, MD; Sampson Goon, MD 22 minutes ago (2:41 PM)         I called that number, but there was no response. Left message for answering machine and recorded my number over there to call me back. Theron Arista could you please follow with this message to be sure that nothing urgent is going on for that patient.     Thanks,     [employer] text

## 2019-11-12 NOTE — Telephone Encounter (Signed)
Please call peer to peer DR. Vonna Drafts [phone]

## 2019-11-12 NOTE — Telephone Encounter (Signed)
FAXED MD NOTES TO FAX NUMBER PROVIDED

## 2019-11-13 NOTE — Telephone Encounter (Signed)
[place] calling from [employer] to ask Dr. Lavell Islam to please call Dr. Vonna Drafts at [phone] for the peer to peer.

## 2019-11-16 NOTE — Telephone Encounter (Signed)
[place] calling from [employer] to ask Dr. Riazi to please call Dr. Tasker at [phone] for the peer to peer.

## 2019-11-17 NOTE — Telephone Encounter (Signed)
Chrisandra Netters, MD  Milus Height; Sampson Goon, MD; Royston Sinner, MD 15 hours ago (5:30 PM)         I called that number on 5:30 pm. There was no answer and I left a message.    Message text       [place], Darleen Crocker, MD 15 hours ago (5:16 PM)     VL    Chrisandra Netters, MD Vivien Rota, MD; Sampson Goon, MD 4 days ago         I called that number, but there was no response. Left message for answering machine and recorded my number over there to call me back. Theron Arista could you please follow with this message to be sure that nothing urgent is going on for that patient.     Thanks,     [employer] text     Message text

## 2019-11-23 ENCOUNTER — Telehealth: Payer: Self-pay | Admitting: Retina Specialist

## 2019-11-23 NOTE — Telephone Encounter (Signed)
Patient calling having eye pain, irritation causing throbbing headache. Called the office connected patient with [facility] for Triage, patient is requesting an appointment in 3 days to arrange spanish interpreter per pt lawyer. Thank you.

## 2019-11-23 NOTE — Telephone Encounter (Signed)
Natalie Webb spoke with patient and appt scheduled.

## 2019-11-24 ENCOUNTER — Telehealth: Payer: Self-pay | Admitting: Ophthalmology

## 2019-11-24 NOTE — Telephone Encounter (Signed)
Patient calling in stating that she is scheduled to have surgery on 12/09 and would like to verify if the referral was submitted to the insurance company and if so when was it submitted to the insurance.     Patient also has a post-op scheduled on 12/01. Please verify if appt was supposed to be a pre-op or if it needs to be r/s till after the surgery.

## 2019-12-01 NOTE — Telephone Encounter (Signed)
I spoke with case worker. email was sent to her and adjuster with auth request.

## 2019-12-07 ENCOUNTER — Ambulatory Visit: Payer: Worker's Comp, Other unspecified | Attending: Retina Specialist | Admitting: Retina Specialist

## 2019-12-07 ENCOUNTER — Encounter: Payer: Self-pay | Admitting: Retina Specialist

## 2019-12-07 ENCOUNTER — Ambulatory Visit: Payer: Worker's Comp, Other unspecified

## 2019-12-07 DIAGNOSIS — R519 Headache, unspecified: Secondary | ICD-10-CM | POA: Insufficient documentation

## 2019-12-07 DIAGNOSIS — H5462 Unqualified visual loss, left eye, normal vision right eye: Secondary | ICD-10-CM | POA: Insufficient documentation

## 2019-12-07 DIAGNOSIS — H538 Other visual disturbances: Secondary | ICD-10-CM

## 2019-12-07 DIAGNOSIS — H33052 Total retinal detachment, left eye: Secondary | ICD-10-CM | POA: Insufficient documentation

## 2019-12-07 DIAGNOSIS — S0532XD Ocular laceration without prolapse or loss of intraocular tissue, left eye, subsequent encounter: Secondary | ICD-10-CM

## 2019-12-07 DIAGNOSIS — S0532XA Ocular laceration without prolapse or loss of intraocular tissue, left eye, initial encounter: Secondary | ICD-10-CM

## 2019-12-07 MED ORDER — PREDNISOLONE ACETATE 1 % OP SUSP
1.0000 [drp] | Freq: Four times a day (QID) | OPHTHALMIC | 3 refills | Status: DC
Start: 2019-12-07 — End: 2020-02-11

## 2019-12-07 NOTE — Progress Notes (Signed)
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 Erroll Luna)    #s/p PPV/MP/Retinectomy/EL/AfX/SO & Choroidal Drainage OS (Date 03/26/18)  - Original plan to keep oil for 6 months, however worsening ERM now with TRD     # s/p PPV/SOR/MP/EL/SOI (Date 08/13/18 by Riazi/Puri/Clark) for TRD OS  OD: 20/25, VMA, good foveal contour, no RT/RD  Observe       OS: XT and ptosis, HM, mild cornal cloudiness, apakia, aniridia, SO not emulsified, 360 retinectomy, retina flat with some translocation of macula towards inferior and atrophy same as before, mild ERM, no heme, IOP 5 mmHg  Plan: Observe,     Cont Pred forte QID    #Headache  - splitting headache since trauma to left eye  - mildly helped with Tylenol  - no clear ocular explanation  She feels that it got worse  Already referred to Neurology  Plan: Consult for another time     #Glaucoma suspect based on C/D ratio  - unknown FHx  - IOP WNL in OD  - OCT nfl full OD  - HVF OD with high total deviation, nonspecific pattern deficits  - 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 epithelial downgrowth nasally?    Anterior  Chamber Deep and quiet tr cell/pigment    Iris Round, no rubeosis no visible iris tissue    Lens Clear aphakia    Vitreous Clear SO not emulsified          Fundus Exam       Right Left    Disc Healthy contour and size perfused    C/D Ratio 0.7 0.3    Macula Normal contour and reflex for age Flat, no heme, faint ERM, atrophy, no CME, atrophic retina, macular ectopia inferiorly, fibrosis AT THE RETINECTOMY SITE, no heme    Vessels Normal Normal    Periphery Flat and attached 360 degrees 360 retinectomy w laser, preretinal heme sup and inf but outside of retina signifcant improved, membrane superiorly with trace traction but no change in terms of detachment or breaks, no heme             <div id="MAIN_EXAM_REVIEWED"></div>     - See Assesment/plan above    Follow-up plan:  Riazi  OCTm Fundus photo FA ICG FAF OCT  RNFL Bscan Other  Injection Laser   4 Months  ou                 I reviewed and confirmed all history components,  including the HPI; and any other elements performed by the technician. I performed the key and critical portions of the exam. The final examination findings, image interpretations, and plan as documented in the record represent my personal judgement and conclusion.     Dorann Lodge MD.    _

## 2019-12-08 ENCOUNTER — Telehealth: Payer: Self-pay | Admitting: Retina Specialist

## 2019-12-08 NOTE — Telephone Encounter (Signed)
PA FOR PREDFORTE SENT TO WORKERS COMP

## 2019-12-08 NOTE — Telephone Encounter (Signed)
Patient called in stating to send Pred Forte prescription to her w/c insurance for [employer] approval. Patient states it's expensive and does not want to pay out of pocket for her eye drops. Please assist.

## 2019-12-09 DIAGNOSIS — H02402 Unspecified ptosis of left eyelid: Secondary | ICD-10-CM | POA: Insufficient documentation

## 2019-12-14 ENCOUNTER — Telehealth: Payer: Self-pay | Admitting: Ophthalmology

## 2019-12-14 NOTE — Telephone Encounter (Signed)
Patient calling in stating that her surgery is scheduled for 12/09 but she has a post op appt on 12/01 she would like to know what's going to happen to that appt because when her surgery was postponed she was told that they will just leave the 12/01 scheduled just in case. Please call patient back for further assistance.

## 2019-12-15 ENCOUNTER — Telehealth: Payer: Self-pay | Admitting: Ophthalmology

## 2019-12-15 NOTE — Telephone Encounter (Signed)
The pt is asking for more information on where they stand on the status for an auth for her procedure  Please follow up and keep the pt in the loop of her status so she knows everything will be in order by the time of her appts and procedure  please

## 2019-12-15 NOTE — Telephone Encounter (Signed)
Spoke with patient, still currently looking to obtain status of her auth from her workers comp rep.

## 2019-12-16 ENCOUNTER — Ambulatory Visit: Payer: Worker's Comp, Other unspecified | Admitting: Ophthalmology

## 2019-12-16 ENCOUNTER — Ambulatory Visit: Payer: Worker's Comp, Other unspecified

## 2019-12-16 NOTE — Anesthesia Preprocedure Evaluation (Addendum)
ANESTHESIA PRE-OPERATIVE EVALUATION    Patient Information    Name: Natalie Webb    MRN: [id number]    DOB: [birth year]    Age: 44 year old    Sex: female  Procedure(s):  T2 OP LEFT UPPER EYELID PTOSIS REPAIR      Pre-op Vitals:   There were no vitals taken for this visit.        Primary language spoken:  Spanish    ROS/Medical History:       History of Present Illness: 12F w/ Ptosis of left eyelid     H/o left ruptured globe 03/13/18 from work related injury 03/13/18 Initial repair of ruptured globe and PPV left eye 03/26/18        General:  negative for General ROS    Can do moderate to heavy activities ([facility] >= 4) per PSQ 12/16/19    No social history    01/01/20 hospitalist note (epic)  The patient has no history of heart disease and reports no chest pain, shortness of breath, DOE, orthopnea, PND, syncope, or palpitations. Cardiac history none  The patient is optimized for surgery       Cardiovascular:  negative cardio ROS  EKG (03/13/18): NSR, 63 bpm   Anesthesia History:  no history of anesthetic complications,  no family history of anesthetic complications,  PSH:  -C/s  03/13/18 Initial repair of ruptured globe as emergency. RSI glide, grade 1, 6.5  03/26/18 PPV left eye Mac4, Gr2  08/13/18 PARS PLANA VITRECTOMY, SILICONE OIL REMOVAL, MEMBRANE PEEL, ENDOLASER, AIRFLUID EXCHANGE, OIL RIGHT EYE - Mac3, Gr1   Pulmonary:   negative pulmonary ROS     Neuro/Psych:   Left ruptured globe 03/13/18 from work related injury  Headache   Decreased vision of left eye Hematology/Oncology:   hematologic/lymphatic negative      GI/Hepatic:  negative GI/hepatic ROS Infectious Disease:  negative for infectious disease     Renal:  negative renal ROS   Endocrine/Other:  negative endo/other ROS     Pregnancy History:   Pediatrics:         Pre Anesthesia Testing (PCC/CPC) notes/comments:    [facility] Test & records reviewed by [facility] Provider.                      Reviewed by Jarrett Soho Jun RN 12/16/2019 2:13 PM     Pending  ( ) h&p   (x) PSQ  (x ) Sophie NP  to review  Data reviewed 12/16/2019 3:12 PM- SdjNP  Tylenol pre-op order  Pending: Above    Reviewed PSQ   Hannah Jun RN 12/17/2019 3:12 PM     ( ) pending h&p  Hannah Jun RN 12/23/2019 1:31 PM     01/01/20 hospitalist note (epic)  The patient is optimized for surgery  Hannah Jun RN 01/04/2020 3:38 PM                          Physical Exam    Airway:  Inter-inciser distance > 4 cm  Prognanth Able    Mallampati: II  Neck ROM: full  TM distance: > 6 cm          Cardiovascular:  - cardiovascular exam normal         Pulmonary:  - pulmonary exam normal           Neuro/Neck/Skeletal/Skin:      Dental:  - normal exam    Abdominal:  General: obesity     Additional Clinical Notes:                                                                                                        Past Medical History:   Diagnosis Date    Retinal detachment      Past Surgical History:   Procedure Laterality Date    PARS PLANA REPAIR OF RETINAL DEATACHMENT Left 08/13/2018    Pars plana vitrectomy, silicone oil removal, membrane peel, PFO, endolaser, silicone oil injection of the Left eye.    PARS PLANA VITRECTOMY, ENDOLASER, membrane peel, Silicone OIL,retinectomy LEFT EYE Left 03/26/2018     Social History     Tobacco Use    Smoking status: Never Smoker    Smokeless tobacco: Never Used   Substance Use Topics    Alcohol use: Never    Drug use: Not on file       No current facility-administered medications for this encounter.     Current Outpatient Medications   Medication Sig Dispense Refill    acetaminophen (TYLENOL) 325 MG tablet Take 2 tablets (650 mg) by mouth every 4 hours as needed for Mild Pain (Pain Score 1-3). 30 tablet 3    anterior blue eye kit USE AS INSTRUCTED BY YOUR PHYSICIAN (KIT FOR RIGHT EYE) 1 kit 0    carboxymethylcellulose, PF, 0.5 % SOLN Place 1 drop into both eyes 4 times daily. 1 each 11    ferrous sulfate 325 (65 Fe) MG tablet Take 325 mg by mouth 3 times daily.      prednisoLONE acetate (PRED FORTE) 1 %  ophthalmic suspension Place 1 drop into left eye 4 times daily. 1 each 3     No Known Allergies    Labs and Other Data  Lab Results   Component Value Date    SODIUM 134 (L) 03/13/2018    K 4.1 03/13/2018    CL 102 03/13/2018    CO2 25 03/13/2018    BUN 12 03/13/2018    CREAT 0.7 03/13/2018    GLU 97 03/13/2018    Lyndon 11.4 (H) 03/13/2018     No results found for: AST, ALT, GGT, LDH, ALK, TP, ALB, TBILI, DBILI  Lab Results   Component Value Date    WBCCOUNT 10.9 (H) 03/13/2018    RBC 4.44 03/13/2018    HGB 11.9 03/13/2018    HCT 36.0 03/13/2018    MCV 81.2 (L) 03/13/2018    MCHC 33.2 03/13/2018    RDW 15.3 (H) 03/13/2018    PLT 424 (H) 03/13/2018    MPVH 7.4 03/13/2018     Lab Results   Component Value Date    INR 1.02 03/13/2018    PTT 27.9 03/13/2018     No results found for: ARTPH, ARTPO2, ARTPCO2    Anesthesia Plan:  Risks and Benefits of Anesthesia  I personally examined the patient immediately prior to the anesthetic and reviewed the pertinent medical history, drug and allergy history, laboratory and imaging studies and consultations. I have determined that the patient  has had adequate assessment and testing.    Anesthetic techniques, invasive monitors, anesthetic drugs for induction, maintenance and post-operative analgesia, risks and alternatives have been explained to the patient and/or patient's representatives.    I have prescribed the anesthetic plan:         Planned anesthesia method: General and Monitored Anesthesia Care         ASA 2 (Mild systemic disease)     Potential anesthesia problems identified and risks including but not limited to the following were discussed with patient and/or patient's representative: Dental injury or sore throat    Planned monitoring method: Routine monitoring    Informed Consent:  Anesthetic plan and risks discussed with Patient.    Plan discussed with CRNA.

## 2019-12-17 ENCOUNTER — Telehealth: Payer: Self-pay | Admitting: Ophthalmology

## 2019-12-17 NOTE — Telephone Encounter (Signed)
Patient would like to make sure we have an auth for her surgery

## 2019-12-21 NOTE — Telephone Encounter (Signed)
Spoke with patient and notified her Berkley Harvey is still pending.

## 2019-12-22 ENCOUNTER — Ambulatory Visit: Payer: Worker's Comp, Other unspecified

## 2019-12-23 ENCOUNTER — Ambulatory Visit: Payer: Worker's Comp, Other unspecified | Attending: Ophthalmology

## 2019-12-23 ENCOUNTER — Ambulatory Visit: Payer: Self-pay

## 2019-12-23 DIAGNOSIS — Z20822 Contact with and (suspected) exposure to covid-19: Secondary | ICD-10-CM | POA: Insufficient documentation

## 2019-12-23 DIAGNOSIS — Z1152 Encounter for screening for COVID-19: Secondary | ICD-10-CM | POA: Insufficient documentation

## 2019-12-23 DIAGNOSIS — H02402 Unspecified ptosis of left eyelid: Secondary | ICD-10-CM | POA: Insufficient documentation

## 2019-12-23 DIAGNOSIS — Z1159 Encounter for screening for other viral diseases: Secondary | ICD-10-CM | POA: Insufficient documentation

## 2019-12-23 LAB — CORONAVIRUS DISEASE 2019 (COVID-19) SCOV
COVID-19 Comment: NOT DETECTED
COVID-19 Result: NOT DETECTED

## 2019-12-23 NOTE — Progress Notes (Signed)
[facility] DEPARTMENT OF FAMILY MEDICINE - COVID Drive Through Testing    DATE/TIME: December 23, 2019 13:19    CHIEF COMPLAINT: COVID 19 Testing in absence of symptoms    Patient Name: Natalie Webb        DOB: Jul 17, 1975           MRN: [id number]    HISTORY OF PRESENT ILLNESS: Natalie Webb is a 44 year old female who presents for COVID19 testing with order pending from [facility] physician performing procedure. Patient is asymptomatic.      Diagnosis:  Diagnoses and all orders for this visit:    Screening for viral disease    Ptosis of left eyelid  -     Coronavirus Disease 2019 (COVID-19) SCOV Nasal-Pharyngeal         Patient Verification:  Name and DOB    Visualization of Patient in Car: No Distress    Swab performed and Specimen sent to Lab:  Yes    Patient education provided to the patient. Counseling on masking, social distancing, and strict handwashing provided.    [place] to be notified of results by physician performing procedure.    Kae Heller, NP   Family Nurse Practitioner  [facility] COVID Testing Drive Through

## 2019-12-24 ENCOUNTER — Telehealth: Payer: Self-pay | Admitting: Ophthalmology

## 2019-12-24 ENCOUNTER — Telehealth: Payer: Self-pay | Admitting: Retina Specialist

## 2019-12-24 DIAGNOSIS — Z01818 Encounter for other preprocedural examination: Secondary | ICD-10-CM

## 2019-12-24 DIAGNOSIS — H02402 Unspecified ptosis of left eyelid: Secondary | ICD-10-CM | POA: Insufficient documentation

## 2019-12-24 NOTE — Telephone Encounter (Signed)
Please follow up with calling the pts pre op clearance office, sue is asking to speak to a nurse or tech to answer some of the questions they have about the pt. Urgent they speak to someone  Thank you

## 2019-12-24 NOTE — Telephone Encounter (Signed)
Patient is calling requesting status updates on her referral/ auth for procedure. Please advise. Thank you

## 2019-12-25 NOTE — Telephone Encounter (Signed)
Spoke with Natalie Webb and answered questions

## 2019-12-29 NOTE — Telephone Encounter (Signed)
Spoke with patient, updated her with information regarding authorizations. preop appt scheduled.

## 2020-01-01 ENCOUNTER — Ambulatory Visit: Payer: Worker's Comp, Other unspecified | Attending: Hospitalist | Admitting: Hospitalist

## 2020-01-01 VITALS — BP 148/88 | HR 90 | Temp 97.7°F | Resp 18 | Ht 62.01 in | Wt 195.5 lb

## 2020-01-01 DIAGNOSIS — I10 Essential (primary) hypertension: Secondary | ICD-10-CM | POA: Insufficient documentation

## 2020-01-01 DIAGNOSIS — Z6835 Body mass index (BMI) 35.0-35.9, adult: Secondary | ICD-10-CM | POA: Insufficient documentation

## 2020-01-01 DIAGNOSIS — Z01810 Encounter for preprocedural cardiovascular examination: Secondary | ICD-10-CM | POA: Insufficient documentation

## 2020-01-01 DIAGNOSIS — H02402 Unspecified ptosis of left eyelid: Secondary | ICD-10-CM | POA: Insufficient documentation

## 2020-01-01 MED ORDER — AMLODIPINE 5 MG OR TABS: 5.0000 mg | ORAL_TABLET | Freq: Every day | ORAL | Status: AC

## 2020-01-01 NOTE — Progress Notes (Signed)
[facility] Hospitalist Program      Date of Evaluation:  01/01/2020    Primary Care Physician:  No Pcp, Per Patient     Referring Physician:  No Pcp, Per Patient     History of Present Illness:  This is a 44 year old female who presents with ptosis and is scheduled for left ptosis repair, which is a low risk surgery anticipated to be done under MAC anesthesia, and is referred for preoperative evaluation.    Cardiac Risk Assessment:  The patient has no history of heart disease and reports no chest pain, shortness of breath, DOE, orthopnea, PND, syncope, or palpitations. Cardiac history none    Recent (<5 years) prior cardiac work-up includes: none.    The patient has has no major cardiac contraindications to non-emergent surgery  and has no Revised Cardiac Risk Index Clinical predictors .          Other History of Present Illness:  .    Allergies:  No Known Allergies     Medications:  Outpatient Medications Marked as Taking for the 01/01/20 encounter (Consultation) with HOSPITALIST PRE-OP PRACTICE   Medication Sig Dispense Refill    acetaminophen (TYLENOL) 325 MG tablet Take 2 tablets (650 mg) by mouth every 4 hours as needed for Mild Pain (Pain Score 1-3). 30 tablet 3    amLODIPINE (NORVASC) 5 MG tablet Take 5 mg by mouth daily.      anterior blue eye kit USE AS INSTRUCTED BY YOUR PHYSICIAN (KIT FOR RIGHT EYE) 1 kit 0    carboxymethylcellulose, PF, 0.5 % SOLN Place 1 drop into both eyes 4 times daily. 1 each 11    ferrous sulfate 325 (65 Fe) MG tablet Take 325 mg by mouth 3 times daily.      prednisoLONE acetate (PRED FORTE) 1 % ophthalmic suspension Place 1 drop into left eye 4 times daily. 1 each 3               Past Medical/Surgical History:  Patient Active Problem List   Diagnosis    Ruptured globe of left eye, initial encounter    Ruptured globe of left eye, subsequent encounter    Total retinal detachment of left eye    Follow-up examination after eye surgery    Decreased vision of left eye     Blurry vision    Eye pain, left    Ptosis of left eyelid    Headache     Past Surgical History:   Procedure Laterality Date    PARS PLANA REPAIR OF RETINAL DEATACHMENT Left 08/13/2018    Pars plana vitrectomy, silicone oil removal, membrane peel, PFO, endolaser, silicone oil injection of the Left eye.    PARS PLANA VITRECTOMY, ENDOLASER, membrane peel, Silicone OIL,retinectomy LEFT EYE Left 03/26/2018       Family History:  Noncontributory for CAD or coagulopathy. No family history on file.. .    Social History:  Social History     Socioeconomic History    Marital status: Single     Spouse name: Not on file    Number of children: Not on file    Years of education: Not on file    Highest education level: Not on file   Occupational History    Not on file   Tobacco Use    Smoking status: Never Smoker    Smokeless tobacco: Never Used   Substance and Sexual Activity    Alcohol use: Never    Drug use: Not  on file    Sexual activity: Not on file   Other Topics Concern    Not on file   Social History Narrative    Not on file     Social Determinants of Health     Financial Resource Strain:     Difficulty of Paying Living Expenses: Not on file   Food Insecurity:     Worried About [place] in the Last Year: Not on file    Ran Out of Food in the Last Year: Not on file   Transportation Needs:     Lack of Transportation (Medical): Not on file    Lack of Transportation (Non-Medical): Not on file   Physical Activity:     Days of Exercise per Week: Not on file    Minutes of Exercise per Session: Not on file   Stress:     Feeling of Stress : Not on file   Social Connections:     Frequency of Communication with Friends and Family: Not on file    Frequency of Social Gatherings with Friends and Family: Not on file    Attends Religious Services: Not on file    Active Member of Clubs or Organizations: Not on file    Attends Archivist Meetings: Not on file    Marital Status: Not on file    Intimate Partner Violence:     Fear of Current or Ex-Partner: Not on file    Emotionally Abused: Not on file    Physically Abused: Not on file    Sexually Abused: Not on file   Housing Stability:     Unable to Pay for Housing in the Last Year: Not on file    Number of [place] in the Last Year: Not on file    Unstable Housing in the Last Year: Not on file         Review Of Systems:    Infectious: Reports no fever, sinus congestion, cough, sputum, dysuria, or diarrhea  Immunological: Reports no history of immune deficiency  Endocrine: Reports no diabetes, thyroid disease, or risk factors for adrenal suppression   Hematological: Reports no recent use of antiplatelet drugs or anticoagulants, easy bruising or bleeding, coagulopathy, active malignancy, or risk factors for thrombosis     The remaining Review of Systems is negative.      Physical Exam:  Vital Signs:  BP 148/88 (BP Location: Left arm, BP Patient Position: Sitting, BP cuff size: Regular)    Pulse 90    Temp 97.7 F (36.5 C) (Temporal)    Resp 18    Ht 5' 2.01" (1.575 m)    Wt 88.7 kg (195 lb 8.8 oz)    SpO2 98%    BMI 35.76 kg/m     General:  female who appears NAD  HEENT: EOMI, left ptosis, oropharynx clear  Neck:  supple, normal cartoid pulses, no bruits, no JVD  Lungs: clear (B), symmetric chest expansion   Heart: regular, S1 S2, no murmurs  Abdomen:  soft, non-tender, normal bowel sounds, no HSM, no bruits  Extremities: no edema  Skin: no large ecchymosis or petechiae  Neurological: motor and sensory exams grossly intact, alert and oriented x 4.    Diagnostic Data:  EKG : not indicated        Laboratory Data:        Common Labs:  BUN   Date Value Ref Range Status   03/13/2018 12 7 - 25 mg/dL Final  Chloride   Date Value Ref Range Status   03/13/2018 102 98 - 107 mmol/L Final     CO2   Date Value Ref Range Status   03/13/2018 25 21 - 31 mmol/L Final     eGFR - high estimate   Date Value Ref Range Status   03/13/2018 >60 >59 Final      Comment:     (Unit: mL/min/1.73 sq mtr)  The GFR calculation is an estimate and is NOT an accurate reflection of GFR among patients on dialysis.       Glucose   Date Value Ref Range Status   03/13/2018 97 70 - 115 mg/dL Final     Comment:        Normal Fasting Glucose:  <100 mg/dL  Impaired Fasting Glucose: 100-125 mg/dL  Provisional DX of diabetes(must be confirmed) >125 mg/dL       Hematocrit   Date Value Ref Range Status   03/13/2018 36.0 34.0 - 44.0 % Final     Hgb   Date Value Ref Range Status   03/13/2018 11.9 11.5 - 15.0 G/DL Final     PLT Count   Date Value Ref Range Status   03/13/2018 424 (H) 150 - 400 THOUS/MCL Final     Potassium   Date Value Ref Range Status   03/13/2018 4.1 3.5 - 5.1 mmol/L Final             HbA1c:No results found for: HGBA1C            (UA: No results found for: COLORUA, APPEARUA, GLUCOSEUA, BILIUA, KETONEUA, SGUA, BLOODUA, PHUA, PROTEINUA, UROBILUA, NITRITEUA, LEUKESTUA, WBCUA, RBCUA, EPITHCELLSUA, HYALINEUA, CRYSTALSUA, COMMENTSUA}              Assessment:  # The patient has no major cardiac contraindications to non-emergent surgery and is scheduled for an elective low-risk procedure.  Therefore, the cardiac risk is low (<2%) anticipated cardiovascular events.    # The patient has no chronic lung disease and is at a low risk for perioperative pulmonary complications due to the type of surgery and/or patient-specific risk factors.    #HTN: controlled with amlodipine             Recommendations:    Preoperative Testing: none indicated  Medical Optimization: The patient is optimized for surgery  Patient/Family Education: risk of medical co-morbidity contributing to risk of surgery, risk of delaying surgery, perioperative medication adjustments.     Other Recommendations:  -The patient needs no further cardiac evaluation or cardiac optimization.    Recommend incentive spirometry perioperatively to minimize the risk of pulmonary complications.  Supplemental oxygen +/- CPAP PRN  hypoxia    - Recommend VTE prophylaxis per approved algorithm order set.  - Hold all vitamins and herbal medications on the day of surgery.  - Continue all other usual medications.        Tora Duck, MD  [facility]

## 2020-01-01 NOTE — Patient Instructions (Signed)
Continue taking amlodipine up to the morning of surgery.

## 2020-01-04 ENCOUNTER — Ambulatory Visit: Payer: Worker's Comp, Other unspecified | Attending: Retina Specialist

## 2020-01-04 DIAGNOSIS — H33052 Total retinal detachment, left eye: Secondary | ICD-10-CM | POA: Insufficient documentation

## 2020-01-04 DIAGNOSIS — Z09 Encounter for follow-up examination after completed treatment for conditions other than malignant neoplasm: Secondary | ICD-10-CM | POA: Insufficient documentation

## 2020-01-04 DIAGNOSIS — H538 Other visual disturbances: Secondary | ICD-10-CM | POA: Insufficient documentation

## 2020-01-04 DIAGNOSIS — S0532XA Ocular laceration without prolapse or loss of intraocular tissue, left eye, initial encounter: Secondary | ICD-10-CM | POA: Insufficient documentation

## 2020-01-04 DIAGNOSIS — H5462 Unqualified visual loss, left eye, normal vision right eye: Secondary | ICD-10-CM | POA: Insufficient documentation

## 2020-01-04 DIAGNOSIS — H02402 Unspecified ptosis of left eyelid: Secondary | ICD-10-CM | POA: Insufficient documentation

## 2020-01-04 DIAGNOSIS — Z1159 Encounter for screening for other viral diseases: Secondary | ICD-10-CM | POA: Insufficient documentation

## 2020-01-04 DIAGNOSIS — Z01818 Encounter for other preprocedural examination: Secondary | ICD-10-CM | POA: Insufficient documentation

## 2020-01-04 DIAGNOSIS — Z20822 Contact with and (suspected) exposure to covid-19: Secondary | ICD-10-CM | POA: Insufficient documentation

## 2020-01-04 LAB — CORONAVIRUS DISEASE 2019 (COVID-19) SCOV
COVID-19 Comment: NOT DETECTED
COVID-19 Result: NOT DETECTED

## 2020-01-04 NOTE — Progress Notes (Signed)
 [facility] DEPARTMENT OF FAMILY MEDICINE - COVID Drive Through Testing    DATE/TIME: January 04, 2020 08:34    CHIEF COMPLAINT: COVID 19 Testing in absence of symptoms    Patient Name: Tariana Moldovan        DOB: September 17, 1975           MRN: [id number]    HISTORY OF PRESENT ILLNESS: Jodel Mayhall is a 44 year old female who presents for COVID19 testing with order pending from [facility] physician performing procedure. Patient is asymptomatic.      Diagnosis:  Diagnoses and all orders for this visit:    Screening for viral disease    Pre-op evaluation  -     Coronavirus Disease 2019 (COVID-19) SCOV Nasal-Pharyngeal         Patient Verification:  Name and DOB    Visualization of Patient in Car: No Distress    Swab performed and Specimen sent to Lab:  Yes    Patient education provided to the patient. Counseling on masking, social distancing, and strict handwashing provided.    Jessa to be notified of results by physician performing procedure.    Kae Heller, NP   Family Nurse Practitioner  [facility] COVID Testing Drive Through

## 2020-01-06 ENCOUNTER — Ambulatory Visit (HOSPITAL_BASED_OUTPATIENT_CLINIC_OR_DEPARTMENT_OTHER): Payer: Worker's Comp, Other unspecified

## 2020-01-06 ENCOUNTER — Encounter: Admission: RE | Disposition: A | Discharge status: Home Health | Payer: Self-pay | Attending: Ophthalmology

## 2020-01-06 ENCOUNTER — Ambulatory Visit
Admission: RE | Admit: 2020-01-06 | Discharge: 2020-01-06 | Disposition: A | Discharge status: Home / Self Care | Payer: Worker's Comp, Other unspecified | Attending: Ophthalmology | Admitting: Ophthalmology

## 2020-01-06 ENCOUNTER — Ambulatory Visit: Payer: Worker's Comp, Other unspecified

## 2020-01-06 DIAGNOSIS — H02402 Unspecified ptosis of left eyelid: Secondary | ICD-10-CM | POA: Insufficient documentation

## 2020-01-06 SURGERY — REPAIR, BLEPHAROPTOSIS
Anesthesia: Monitored Anesthesia Care (MAC) | Site: Eye | Laterality: Left | Wound class: Class I (Clean)

## 2020-01-06 MED ORDER — LIDOCAINE-EPINEPHRINE 1 %-1:100000 IJ SOLN
INTRAMUSCULAR | Status: AC
Start: 2020-01-06 — End: ?
  Filled 2020-01-06: qty 30

## 2020-01-06 MED ORDER — NEOMYCIN-POLYMYXIN-DEXAMETH 3.5-10000-0.1 OP OINT
1.0000 | TOPICAL_OINTMENT | Freq: Two times a day (BID) | OPHTHALMIC | 1 refills | Status: AC
Start: 2020-01-06 — End: 2020-01-20

## 2020-01-06 MED ORDER — ERYTHROMYCIN 5 MG/GM OP OINT
TOPICAL_OINTMENT | OPHTHALMIC | Status: AC
Start: 2020-01-06 — End: ?
  Filled 2020-01-06: qty 3.5

## 2020-01-06 MED ORDER — HYDROCODONE-ACETAMINOPHEN 5-325 MG OR TABS
ORAL_TABLET | ORAL | Status: AC
Start: 2020-01-06 — End: 2020-01-06
  Filled 2020-01-06: qty 1

## 2020-01-06 MED ORDER — ERYTHROMYCIN 5 MG/GM OP OINT
TOPICAL_OINTMENT | OPHTHALMIC | Status: DC | PRN
Start: 2020-01-06 — End: 2020-01-06
  Administered 2020-01-06 (×2): 1 via OPHTHALMIC

## 2020-01-06 MED ORDER — TETRACAINE HCL 0.5 % OP SOLN
OPHTHALMIC | Status: DC | PRN
Start: 2020-01-06 — End: 2020-01-06
  Administered 2020-01-06: 2 [drp] via OPHTHALMIC

## 2020-01-06 MED ORDER — OXYCODONE HCL 5 MG OR TABS
5.0000 mg | ORAL_TABLET | ORAL | Status: DC | PRN
Start: 2020-01-06 — End: 2020-01-06

## 2020-01-06 MED ORDER — BUPIVACAINE HCL (PF) 0.5 % IJ SOLN
INTRAMUSCULAR | Status: AC
Start: 2020-01-06 — End: ?
  Filled 2020-01-06: qty 30

## 2020-01-06 MED ORDER — MIDAZOLAM HCL 2 MG/2ML IJ SOLN
INTRAMUSCULAR | Status: DC | PRN
Start: 2020-01-06 — End: 2020-01-06
  Administered 2020-01-06 (×2): 1 mg via INTRAVENOUS

## 2020-01-06 MED ORDER — FENTANYL CITRATE (PF) 100 MCG/2ML IJ SOLN
25.0000 ug | INTRAMUSCULAR | Status: DC | PRN
Start: 2020-01-06 — End: 2020-01-06

## 2020-01-06 MED ORDER — SODIUM BICARBONATE 8.4 % IV SOLN
INTRAVENOUS | Status: DC | PRN
Start: 2020-01-06 — End: 2020-01-06
  Administered 2020-01-06 (×2): 4 mL

## 2020-01-06 MED ORDER — NALOXONE HCL 0.4 MG/ML IJ SOLN
0.2000 mg | Freq: Once | INTRAMUSCULAR | Status: DC | PRN
Start: 2020-01-06 — End: 2020-01-06

## 2020-01-06 MED ORDER — BSS IO SOLN
INTRAOCULAR | Status: AC
Start: 2020-01-06 — End: ?
  Filled 2020-01-06: qty 15

## 2020-01-06 MED ORDER — HYDROCODONE-ACETAMINOPHEN 5-325 MG OR TABS
1.0000 | ORAL_TABLET | ORAL | Status: DC | PRN
Start: 2020-01-06 — End: 2020-01-06
  Administered 2020-01-06 (×2): 1 via ORAL

## 2020-01-06 MED ORDER — FENTANYL CITRATE (PF) 100 MCG/2ML IJ SOLN
INTRAMUSCULAR | Status: DC | PRN
Start: 2020-01-06 — End: 2020-01-06
  Administered 2020-01-06 (×2): 25 ug via INTRAVENOUS

## 2020-01-06 MED ORDER — TETRACAINE HCL 0.5 % OP SOLN
OPHTHALMIC | Status: AC
Start: 2020-01-06 — End: ?
  Filled 2020-01-06: qty 4

## 2020-01-06 MED ORDER — PLASMA-LYTE A IV SOLN
10.0000 mL/h | INTRAVENOUS | Status: DC
Start: 2020-01-06 — End: 2020-01-06

## 2020-01-06 MED ORDER — MEPERIDINE HCL 25 MG/ML IJ SOLN
12.5000 mg | INTRAMUSCULAR | Status: DC | PRN
Start: 2020-01-06 — End: 2020-01-06

## 2020-01-06 MED ORDER — PROPOFOL 200 MG/20ML IV EMUL
INTRAVENOUS | Status: DC | PRN
Start: 2020-01-06 — End: 2020-01-06
  Administered 2020-01-06: 20 mg via INTRAVENOUS
  Administered 2020-01-06: 40 mg via INTRAVENOUS
  Administered 2020-01-06: 20 mg via INTRAVENOUS

## 2020-01-06 MED ORDER — FENTANYL CITRATE (PF) 100 MCG/2ML IJ SOLN
INTRAMUSCULAR | Status: AC
Start: 2020-01-06 — End: ?
  Filled 2020-01-06: qty 2

## 2020-01-06 MED ORDER — OXYCODONE HCL 5 MG OR TABS
10.0000 mg | ORAL_TABLET | ORAL | Status: DC | PRN
Start: 2020-01-06 — End: 2020-01-06

## 2020-01-06 MED ORDER — SODIUM CHLORIDE 0.9 % IR SOLN
Status: DC | PRN
Start: 2020-01-06 — End: 2020-01-06
  Administered 2020-01-06: 500 mL

## 2020-01-06 MED ORDER — PLASMA-LYTE A IV SOLN
INTRAVENOUS | Status: DC
Start: 2020-01-06 — End: 2020-01-06

## 2020-01-06 MED ORDER — ACETAMINOPHEN 500 MG OR TABS
1000.0000 mg | ORAL_TABLET | Freq: Once | ORAL | Status: AC
Start: 2020-01-06 — End: 2020-01-06
  Administered 2020-01-06 (×2): 1000 mg via ORAL

## 2020-01-06 MED ORDER — SODIUM BICARBONATE 8.4 % IV SOLN
INTRAVENOUS | Status: AC
Start: 2020-01-06 — End: ?
  Filled 2020-01-06: qty 50

## 2020-01-06 MED ORDER — PROPOFOL 200 MG/20ML IV EMUL
INTRAVENOUS | Status: AC
Start: 2020-01-06 — End: ?
  Filled 2020-01-06: qty 20

## 2020-01-06 MED ORDER — ACETAMINOPHEN 500 MG OR TABS
ORAL_TABLET | ORAL | Status: AC
Start: 2020-01-06 — End: ?
  Filled 2020-01-06: qty 2

## 2020-01-06 MED ORDER — FENTANYL CITRATE (PF) 100 MCG/2ML IJ SOLN
50.0000 ug | INTRAMUSCULAR | Status: DC | PRN
Start: 2020-01-06 — End: 2020-01-06

## 2020-01-06 MED ORDER — ONDANSETRON HCL 4 MG/2ML IV SOLN
INTRAMUSCULAR | Status: DC | PRN
Start: 2020-01-06 — End: 2020-01-06
  Administered 2020-01-06 (×2): 4 mg via INTRAVENOUS

## 2020-01-06 MED ORDER — MIDAZOLAM HCL (PF) 2 MG/2ML IJ SOLN
INTRAMUSCULAR | Status: AC
Start: 2020-01-06 — End: ?
  Filled 2020-01-06: qty 2

## 2020-01-06 MED ORDER — SODIUM CHLORIDE 0.9 % IV SOLN
12.5000 mg | Freq: Once | INTRAVENOUS | Status: DC | PRN
Start: 2020-01-06 — End: 2020-01-06

## 2020-01-06 MED ORDER — PLASMA-LYTE A IV SOLN
INTRAVENOUS | Status: DC | PRN
Start: 2020-01-06 — End: 2020-01-06

## 2020-01-06 SURGICAL SUPPLY — 21 items
APPLICATOR COTTON TIP 3" X25EA/PK (Misc Surgical Supply) ×2 IMPLANT
APPLICATOR COTTON TIP LF 6" X 25 EA (Misc Surgical Supply) ×4 IMPLANT
BLADE INTUBRITE MAC 3 DISP (Knives/Blades) ×2 IMPLANT
CANISTER SUCTION POROUS 2000 CC RIGID LOCK LID STANDARD ELBOW PORT SHUTOFF VALVE LATEX FREE DISPOSAB (Misc Medical Supply) ×2 IMPLANT
DRAPESTERILE100012"X18" (Drapes/towels) ×2 IMPLANT
HANDLE MED/STANDARD DISP (Misc Surgical Supply) IMPLANT
NEEDLE 25G X 5/8" REG BEVEL (Needles/punch/cannula/biopsy) ×2 IMPLANT
PACK HEAD AND NECK REV 6 (Procedure Packs/kits) ×2 IMPLANT
PACK MINOR REV 7 (Procedure Packs/kits) ×2 IMPLANT
PAD GROUNDING VL ADULT REM (Cranial electrodes/grids) ×2 IMPLANT
PROTECTOR CORNEAL CROUCH (Misc Surgical Supply) ×2 IMPLANT
PROTECTOR CORNEAL CROUCH PEDS (Misc Surgical Supply) IMPLANT
SUTURE SILK PERMA-HAND 7-0 BV-1 18IN 2 ARM BRAID BLACK NONABSORBABLE (Suture) IMPLANT
SUTURE VICRYL TG140-8 7-0 DA VIO 18" J546 (Suture) ×2 IMPLANT
SYRINGE 1ML SLIP TIP (Misc Medical Supply) ×4 IMPLANT
SYRINGE 5ML LUER LOCK TIP (Misc Medical Supply) ×2 IMPLANT
Sponge Lap 18x18in Xray Detectable RFID Sterile (Dressings/packing) ×2 IMPLANT
Sponge Surgical 4x4in 16-ply Xray Detectable RFID Sterile (Dressings/packing) ×2 IMPLANT
Suture Chromic Gut 6-0 HE-1 Double-arm 45cm Undyed (Suture) IMPLANT
Suture Monosof 6-0 P-10 45cm Black (Suture) IMPLANT
TOWELS, STERILE BLUE (Drapes/towels) ×2 IMPLANT

## 2020-01-06 NOTE — Discharge Instructions (Signed)
[facility]atal[facility]  Department of Ophthalmology                                                                     [place], [place]  [phone] [place] Appointments  [phone] [facility] Appointments  [phone] After business hours*     *please ask to speak to the ophthalmologist (eye surgeon) on call.   [facility] Emergency Department: [address] [place], [place] [address]     AFTER YOUR OCULOFACIAL PLASTIC AND RECONSTRUCTIVE SURGERY  Thank you for allowing [place] to be part of your healthcare team.  We look forward to continuing your care in the post-operative period.      Your follow-up appointment after today's surgery is: Wednesday 01/20/2020 10:00 AM in the oculoplastics clinic. Please note that our clinic has moved to a new location:  [facility]  [facility], 2nd Floor, [place]. [address]  [address] [place], [place] [address]    The following are recommendations to make your recovery more comfortable and safe:    Diet:  Resume your normal diet today. It is advised to consume only clear liquids (7-Up, Gatorade) for a few hours after surgery. If you are not experiencing vomiting or nausea, you may slowly advance to a regular diet.    Medications:      -  Please apply the ointment to your incisions twice daily for several days or until the medication runs out. A prescription (printed on a standard 8.5"x11" size piece of paper) with refills has been provided to you in your discharge packet, or has been sent electronically.  ***    -  You may take Tylenol (acetaminophen) as directed on the bottle as needed for any pain or discomfort.  Resume all other medications that you were previously taking unless otherwise instructed.  Do not take any pain relievers containing aspirin for the first week after surgery.      - Do not pick at or vigorously clean the wound. You may place a damp, clean cloth or gauze to loosen crust and matted lashes. Gently  roll a Q-tip to clear debris; otherwise do not touch the wounds.    Ice Packs: You have been provided with an ice pack.  Use it on the way home and for the first 72 hours after surgery.  Use it for twenty (20) minutes on and twenty minutes off while you are awake.  Use crushed ice if possible, or at least use very small cubes.  You must have a very moist gauze pad on the area prior to putting on the ice packs.    Eye Patch: You do not need an eye patch following your procedure.  Use your prescribed eye drops and/or ointment as directed.    ***Wound Care:   For tear duct or orbit surgery: do not blow your nose until released by the doctor.  For tear duct surgery: do not remove any tubes from the nostril or eye; DO NOT rub the eye.  For skin graft surgery: do not remove the eye patch for 48 hours.     Swelling and Bruising: Expect some swelling and black and blue discoloration.  This swelling and discoloration may, because of gravity, tend to collect in the lower eyelids or cheeks.  You may also experience tearing, sensitivity to light, itching, bloody tears, or tightness of the eyelids.  These sensations should gradually improve day to day.    Stitches: All stitches should be kept clean. Your surgeon will discuss timing of removal if necessary.    Activity:  For the first two weeks after your surgery, avoid lifting more than 15 pounds, bending at the waist, working out, straining, and dirty environments (beach, pool, gardening, etc.). You may shower, but avoid touching/rubbing the face or letting excess water or soap run down the face (wash your hair "salon style" by tilting your head back and letting the water run down the back of your head).    Rest/Sleep: When lying down, keep your head elevated on 2-3 pillows, such that your head is above your chest. Do not sleep on sutures or area of surgery. Continue for three (3) days and nights, including while asleep.    Call your doctor if you experience any of the symptoms  listed below, or have any questions after your surgery.  We are available to talk to you by telephone or see you in person 24 hours a day, 7 days a week:    -  You experience severe pain or swelling with inability to open eyes.    -  You have a sudden increase in redness and pus from the eye.    -  You have increased blurring or dimming of your vision.    -  You have bleeding that is excessive or does not stop.    -  You have flashing lights or a dark curtain coming down over your vision.    -  You have a deep aching pain in your eye that is not relieved by Tylenol.    -  You have a temperature greater than 101F degrees.    [facility] is on EPIC MyChart but the best way to communicate during business hours is to use the phone numbers below:     - During normal business hours between the hours of 8:00AM and 4:30PM, Monday through Friday:   [phone] [place]  [phone] [facility] Appointments    - After business hours and during holidays and weekends: please ask to speak to the ophthalmologist (eye surgeon) on call.   [place] [phone]    - [facility] Emergency Department: [address] [place], [place] [address]     [place] email can be used for non-urgent questions but I understand that this may not be checked regularly.         Monitored Anesthesia Care  Anesthesia is a term that refers to techniques, procedures, and medicines that help a person stay safe and comfortable during a medical procedure. Monitored anesthesia care, or sedation, is one type of anesthesia. Your anesthesia specialist may recommend sedation if you will be having a procedure that does not require you to be unconscious, such as:   Cataract surgery.   A dental procedure.   A biopsy.   A colonoscopy.  During the procedure, you may receive a medicine to help you relax (sedative). There are three levels of sedation:   Mild sedation. At this level, you may feel awake and relaxed. You will be able to  follow directions.   Moderate sedation. At this level, you will be sleepy. You may not remember the  procedure.   Deep sedation. At this level, you will be asleep. You will not remember the procedure.  The more medicine you are given, the deeper your level of sedation will be. Depending on how you respond to the procedure, the anesthesia specialist may change your level of sedation or the type of anesthesia to fit your needs. An anesthesia specialist will monitor you closely during the procedure.  Let your health care provider know about:   Any allergies you have.   All medicines you are taking, including vitamins, herbs, eye drops, creams, and over-the-counter medicines.   Any use of steroids (by mouth or as a cream).   Any problems you or family members have had with sedatives and anesthetic medicines.   Any blood disorders you have.   Any surgeries you have had.   Any medical conditions you have, such as sleep apnea.   Whether you are pregnant or may be pregnant.   Any use of cigarettes, alcohol, or street drugs.  What are the risks?  Generally, this is a safe procedure. However, problems may occur, including:   Getting too much medicine (oversedation).   Nausea.   Allergic reaction to medicines.   Trouble breathing. If this happens, a breathing tube may be used to help with breathing. It will be removed when you are awake and breathing on your own.   Heart trouble.   Lung trouble.  Before the procedure  Staying hydrated  Follow instructions from your health care provider about hydration, which may include:   Up to 2 hours before the procedure - you may continue to drink clear liquids, such as water, clear fruit juice, black coffee, and plain tea.  Eating and drinking restrictions  Follow instructions from your health care provider about eating and drinking, which may include:   8 hours before the procedure - stop eating heavy meals or foods such as meat, fried foods, or fatty foods.   6 hours  before the procedure - stop eating light meals or foods, such as toast or cereal.   6 hours before the procedure - stop drinking milk or drinks that contain milk.   2 hours before the procedure - stop drinking clear liquids.  Medicines  Ask your health care provider about:   Changing or stopping your regular medicines. This is especially important if you are taking diabetes medicines or blood thinners.   Taking medicines such as aspirin and ibuprofen. These medicines can thin your blood. Do not take these medicines before your procedure if your health care provider instructs you not to.  Tests and exams   You will have a physical exam.   You may have blood tests done to show:  ? How well your kidneys and liver are working.  ? How well your blood can clot.  General instructions   Plan to have someone take you home from the hospital or clinic.   If you will be going home right after the procedure, plan to have someone with you for 24 hours.    What happens during the procedure?   Your blood pressure, heart rate, breathing, level of pain and overall condition will be monitored.   An IV tube will be inserted into one of your veins.   Your anesthesia specialist will give you medicines as needed to keep you comfortable during the procedure. This may mean changing the level of sedation.   The procedure will be performed.  After the procedure   Your blood pressure,  heart rate, breathing rate, and blood oxygen level will be monitored until the medicines you were given have worn off.   Do not drive for 24 hours if you received a sedative.   You may:  ? Feel sleepy, clumsy, or nauseous.  ? Feel forgetful about what happened after the procedure.  ? Have a sore throat if you had a breathing tube during the procedure.  ? Vomit.  This information is not intended to replace advice given to you by your health care provider. Make sure you discuss any questions you have with your health care provider.  Document  Released: 09/27/2004 Document Revised: 06/10/2015 Document Reviewed: 04/24/2015  Elsevier Interactive Patient Education  2019 [employer].

## 2020-01-06 NOTE — Plan of Care (Signed)
Problem: Promotion of Perioperative Health and Safety  Goal: Promotion of Health and Safety of the Perioperative Patient  Description: The patient remains safe, receives treatment appropriate to the surgical intervention and patient's physiological needs and is discharged or transferred to the appropriate level of care without falls.  Outcome: Progressing  Flowsheets (Taken 01/06/2020 0740)  Standard of Care: Pre-Op [facility]

## 2020-01-06 NOTE — H&P (Signed)
Attending Attestation of H&P:    I agree with the content of the H&P as stated: yes  I have corrections or additions as indicated below: no    History & Physical - Interval Assessment:    Natalie Webb  [id number]    Current medical status:  Unchanged    Medications/allergies:   Unchanged    Review of symptoms:  Unchanged    Physical examination:  I have examined the patient today.  Unchanged    Laboratory or clinical data:  Unchanged    Modifications of initial care plan:  Unchanged      Romie Tay S Foy Vanduyne 01/06/20  9:02 AM

## 2020-01-06 NOTE — Op Note (Signed)
PREOPERATIVE DIAGNOSIS:   1. Left upper eyelid ptosis.     POSTOPERATIVE DIAGNOSIS:   1. Left upper eyelid ptosis.     PROCEDURE:   1. Left upper eyelid ptosis repair by external levator advancement     SURGEON/STAFF: [name], MD  ASSISTANTS: Marlowe Alt, MD    ANESTHESIA: Monitored anesthesia care with local anesthesia  SPECIMENS: None  COMPLICATIONS: None    NOTE: Please note Dr. Lorna Dibble, the attending surgeon, was present and scrubbed for the entire procedure.     INDICATIONS: Natalie Webb is a 44 year old female who presented to the eye clinic with left decreased superior visual fields. The patient must manually lift the eyelids to see, and this affects activities of daily living such as reading and driving. On clinical examination, the patient was noted to have left upper eyelid ptosis. On manual elevation of the upper eyelid, there was a greater than 30 degree improvement in superior visual field.     The patient presented today for definitive treatment of this problem consisting of left upper eyelid ptosis repair by external levator advancement. The risks, benefits, and alternatives to procedure were discussed with the patient in detail, including the risk of bleeding, infection, death, vision loss, blindness, asymmetry, cosmetic deformity, dry eye syndrome, lagophthalmos, eyelid retraction, eyelid malposition, double vision, need for further surgery, and no improvement. All questions were answered and the patient elected to proceed with surgery.     PROCEDURE IN DETAIL:   The patient was brought to the operating room. Under intravenous sedation and monitoring, local anesthesia was injected into the left upper eyelid. This consisted of 1% lidocaine with epinephrine mixed 1:1 with 0.5% Marcaine, followed by a 9:1 dilution with bicarbonate. The patient was prepped and draped in a sterile fashion.     Attention was first turned to the left upper eyelid where a skin incision was made with a #15  blade in the lid crease. The orbicularis layer was then opened, as well as the orbital septum. The levator was identified. It was then advanced and secured to the upper tarsal border using a 6-0 surgipro suture. The patient was then asked to open her eyes. Excellent level and contour were obtained. The Prolene suture was then tied permanently. The margin-to-reflex distance measured +5 in the operating room. An identical procedure was placed laterally. The orbicularis was closed with three interrupted 7-0 vicryl sutures. The skin was closed using a running 6-0 surgipro suture. This completed the left upper eyelid ptosis repair    At the conclusion of the case the preplaced corneal protectors were removed. The patient tolerated the procedure well and was taken to the recovery room in stable condition. The patient was instructed to apply ice the the wounds for the first 48 hours and to apply ophthalmic antibiotic ointment to the eyelid incisions for 1-2 weeks. The patient was instructed to return to the eye clinic immediately for any significant pain or decreased vision. The patient will follow up in the oculoplastics clinic as scheduled.

## 2020-01-06 NOTE — Anesthesia Postprocedure Evaluation (Signed)
Anesthesia Post Note    Patient: Natalie Webb    Procedure(s) Performed: Procedure(s):  T2 OP LEFT UPPER EYELID PTOSIS REPAIR      Final anesthesia type: Monitored Anesthesia Care    Patient location: PACU    Post anesthesia pain: adequate analgesia    Mental status: awake, alert  and oriented    Airway Patent: Yes    Last Vitals:    Vitals Value Taken Time   BP 130/62 01/06/20 1030   Temp 37 C 01/06/20 1030   Pulse 74 01/06/20 1030   Resp 14 01/06/20 1030   SpO2 96 % 01/06/20 1030        Temperature >35.5: Yes    Post vital signs: stable    Hydration: adequate    N/V:no    Plan of care per primary team.

## 2020-01-08 ENCOUNTER — Telehealth: Payer: Self-pay | Admitting: Ophthalmology

## 2020-01-08 NOTE — Telephone Encounter (Signed)
Called patient on 01/08/20. Used [place] interpreter. Patient is POD#2 s/p left upper eyelid ptosis repair. Patient is curious if she can re-start her medications to the LEFT eye (PF QID and PFATs). Patient was unclear from discharge instructions if she can use this medication. Told patient she can re-start the medications. Patient has no other acute complaints to either eye. Instructed patient to go to ER if any worsening pain, redness, changes in vision, etc.    Hessie Knows, MD  [facility] Fellow    Spanish Interpretor [phone]

## 2020-01-18 ENCOUNTER — Ambulatory Visit: Payer: Worker's Comp, Other unspecified

## 2020-01-20 ENCOUNTER — Ambulatory Visit: Payer: Worker's Comp, Other unspecified | Attending: Ophthalmology | Admitting: Ophthalmology

## 2020-01-20 ENCOUNTER — Encounter: Payer: Self-pay | Admitting: Ophthalmology

## 2020-01-20 DIAGNOSIS — H02402 Unspecified ptosis of left eyelid: Secondary | ICD-10-CM | POA: Insufficient documentation

## 2020-01-20 DIAGNOSIS — Z9889 Other specified postprocedural states: Secondary | ICD-10-CM

## 2020-01-20 NOTE — Progress Notes (Signed)
See "Detailed Report" under my note in the Encounter tab for more details regarding today's visit and eye exam.    I reviewed and confirmed the techs HPI, review of systems, past histories, and readings.    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     Today patient reports she is happy with eyelid opening feels like her face looks better, has been using ointment in her eye.    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

## 2020-02-11 ENCOUNTER — Telehealth: Payer: Self-pay | Admitting: Retina Specialist

## 2020-02-11 ENCOUNTER — Ambulatory Visit: Payer: Worker's Comp, Other unspecified | Attending: Retina Specialist | Admitting: Retina Specialist

## 2020-02-11 ENCOUNTER — Encounter: Payer: Self-pay | Admitting: Retina Specialist

## 2020-02-11 DIAGNOSIS — H33052 Total retinal detachment, left eye: Secondary | ICD-10-CM | POA: Insufficient documentation

## 2020-02-11 DIAGNOSIS — H5462 Unqualified visual loss, left eye, normal vision right eye: Secondary | ICD-10-CM

## 2020-02-11 DIAGNOSIS — S0532XA Ocular laceration without prolapse or loss of intraocular tissue, left eye, initial encounter: Secondary | ICD-10-CM

## 2020-02-11 DIAGNOSIS — Z09 Encounter for follow-up examination after completed treatment for conditions other than malignant neoplasm: Secondary | ICD-10-CM

## 2020-02-11 DIAGNOSIS — H02402 Unspecified ptosis of left eyelid: Secondary | ICD-10-CM

## 2020-02-11 DIAGNOSIS — H538 Other visual disturbances: Secondary | ICD-10-CM | POA: Insufficient documentation

## 2020-02-11 MED ORDER — PREDNISOLONE ACETATE 1 % OP SUSP
1.0000 [drp] | Freq: Four times a day (QID) | OPHTHALMIC | 3 refills | Status: DC
Start: 2020-02-11 — End: 2020-04-13

## 2020-02-11 NOTE — Telephone Encounter (Signed)
PA SENT VIA FAX FOR PRED FORTE (RFA)

## 2020-02-11 NOTE — Progress Notes (Addendum)
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 Erroll Luna)    #s/p PPV/MP/Retinectomy/EL/AfX/SO & Choroidal Drainage OS (Date 03/26/18)  - Original plan to keep oil for 6 months, however worsening ERM now with TRD     # s/p PPV/SOR/MP/EL/SOI (Date 08/13/18 by Riazi/Puri/Clark) for TRD OS  OD: 20/25, VMA, good foveal contour, no RT/RD  Observe       OS: XT, HM, mild cornal cloudiness, apakia, aniridia, SO not emulsified, 360 retinectomy, retina flat with some translocation of macula towards inferior and atrophy same as before, mild ERM, no heme, IOP 6 mmHg  Plan: Observe,     Cont Pred forte QID    #Headache  - splitting headache since trauma to left eye  - mildly helped with Tylenol  - no clear ocular explanation  She feels that it got worse  Already referred to Neurology  Plan: Consult for another time     #Glaucoma suspect based on C/D ratio  - unknown FHx  - IOP WNL in OD  - OCT nfl full OD  - HVF OD with high total deviation, nonspecific pattern deficits  - 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 Juel Burrow    # Ptosis of left eyelid  # s/p LELA 01/06/2020 by Dr Lorna Dibble   She is very happy with the result    Interpreter # [phone]    _  _______________________________________   Extended Ophthalmoscopy 02/11/20  by slit lamp and indirect methods reveals:    Main Ophthalmology Exam     External Exam       Right Left    External pigmented skin on R brow periorbital edema          Slit Lamp Exam       Right Left    Lids/Lashes Normal for Age incisions c/d/i no discharge, mild periorbital ecchymosis/swelling, medial area IND    Conjunctiva/Sclera White and quiet, no  pigmentary changes White and quiet    Cornea Clear nasal haze extending 5.7 mm from limbus (prior incision); D tear nasally vs. epithelial downgrowth nasally?    Anterior Chamber Deep and quiet tr cell/pigment    Iris Round, no rubeosis no visible iris tissue    Lens Clear aphakia    Vitreous Clear SO not emulsified          Fundus Exam       Right Left    Disc Healthy contour and size perfused    C/D Ratio 0.7 0.3    Macula Normal contour and reflex for age Flat retina, no heme, faint ERM, atrophy, no CME, atrophic retina, macular ectopia inferiorly, fibrosis AT THE RETINECTOMY SITE, no heme    Vessels Normal Normal    Periphery Flat and attached 360 degrees 360 retinectomy w laser, membrane superiorly with trace traction but no change in terms of detachment or breaks, no heme             <div id="MAIN_EXAM_REVIEWED"></div>     - See Assesment/plan above    Follow-up plan:  [facility]iazi  OCTm Fundus photo FA ICG FAF OCT  RNFL Bscan Other  Injection Laser   3 Months  ou                 I reviewed and confirmed all history components, including the HPI; and any other elements performed by the technician. I performed the key and critical portions of the exam. The final examination findings, image interpretations, and plan as documented in the record represent my personal judgement and conclusion.     Leola Brazil MD.

## 2020-02-17 ENCOUNTER — Ambulatory Visit: Payer: Worker's Comp, Other unspecified | Admitting: Ophthalmology

## 2020-02-29 NOTE — Telephone Encounter (Signed)
PA was approved for predforte

## 2020-03-02 ENCOUNTER — Encounter: Payer: Self-pay | Admitting: Ophthalmology

## 2020-03-02 ENCOUNTER — Ambulatory Visit: Payer: Worker's Comp, Other unspecified | Attending: Ophthalmology | Admitting: Ophthalmology

## 2020-03-02 DIAGNOSIS — H5462 Unqualified visual loss, left eye, normal vision right eye: Secondary | ICD-10-CM | POA: Insufficient documentation

## 2020-03-02 DIAGNOSIS — H538 Other visual disturbances: Secondary | ICD-10-CM | POA: Insufficient documentation

## 2020-03-02 DIAGNOSIS — Z9889 Other specified postprocedural states: Secondary | ICD-10-CM | POA: Insufficient documentation

## 2020-03-02 DIAGNOSIS — S0532XD Ocular laceration without prolapse or loss of intraocular tissue, left eye, subsequent encounter: Secondary | ICD-10-CM

## 2020-03-02 DIAGNOSIS — Z09 Encounter for follow-up examination after completed treatment for conditions other than malignant neoplasm: Secondary | ICD-10-CM | POA: Insufficient documentation

## 2020-03-02 DIAGNOSIS — S0532XA Ocular laceration without prolapse or loss of intraocular tissue, left eye, initial encounter: Secondary | ICD-10-CM

## 2020-03-02 MED ORDER — REFRESH P.M. OP OINT
1.0000 | TOPICAL_OINTMENT | Freq: Every evening | OPHTHALMIC | 11 refills | Status: AC
Start: 2020-03-02 — End: 2020-03-16

## 2020-03-02 NOTE — Progress Notes (Addendum)
See "Detailed Report" under my note in the Encounter tab for more details regarding today's visit and eye exam.    I reviewed and confirmed the techs HPI, review of systems, past histories, and readings.    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 She continues to be happy with the improvement in the eyelid position. C/o blurry vision when reading.    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      SIGNED: Nadean Corwin, MD

## 2020-03-16 ENCOUNTER — Ambulatory Visit: Payer: Worker's Comp, Other unspecified | Attending: Glaucoma Specialist | Admitting: Glaucoma Specialist

## 2020-03-16 DIAGNOSIS — S0532XD Ocular laceration without prolapse or loss of intraocular tissue, left eye, subsequent encounter: Secondary | ICD-10-CM | POA: Insufficient documentation

## 2020-03-16 DIAGNOSIS — H02402 Unspecified ptosis of left eyelid: Secondary | ICD-10-CM | POA: Insufficient documentation

## 2020-03-16 DIAGNOSIS — H33052 Total retinal detachment, left eye: Secondary | ICD-10-CM | POA: Insufficient documentation

## 2020-03-16 DIAGNOSIS — H40001 Preglaucoma, unspecified, right eye: Secondary | ICD-10-CM | POA: Insufficient documentation

## 2020-03-16 DIAGNOSIS — R519 Headache, unspecified: Secondary | ICD-10-CM | POA: Insufficient documentation

## 2020-03-16 NOTE — Progress Notes (Signed)
ICD-10-CM ICD-9-CM   1. 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 Lavell Islam.   -Family history of glaucoma? none  -Prior eye trauma? Yes, OS, injury at work with spinning part from printer hitting left eye     -Non-glaucoma ocular/systemic history:   - none    -Prior eye surgery:   - rupture globe repair OS 2021     -Eyedrop intolerance: none known  - 543/583 pachymetry  -steroid? 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   - full visual field with good rim; monitor with annual testing    I reviewed and confirmed the techs ROS, past histories, and readings.  I saw and examined the patient and reviewed in detail the findings of the resident/fellow. The final examination findings, image interpretations, and plan as documented in the record represent my personal judgment and conclusions.

## 2020-03-17 ENCOUNTER — Telehealth: Payer: Self-pay | Admitting: Glaucoma Specialist

## 2020-03-17 NOTE — Telephone Encounter (Signed)
PA REQUEST WAS FAXED OVER TODAY TO [employer] LAW[employer]ER

## 2020-03-17 NOTE — Telephone Encounter (Signed)
Darel Hong from [employer] states that pt called them and advise that Dr. Juel Burrow needs a letter of agreement. Per Darel Hong is asking if Dr. Juel Burrow has a letter of agreement paperwork he can provide? Her email is [email or web address] and fax is [phone]  If she needs to be contacted her number is [phone]

## 2020-03-21 ENCOUNTER — Telehealth: Payer: Self-pay | Admitting: Ophthalmology

## 2020-03-21 NOTE — Telephone Encounter (Signed)
Natalie Webb from [employer] called back regarding previous message. She stated she did not received the letter of agreement that was faxed on 03.3.22.  She is asking if the office can re-faxed it   Fax# [phone]    Thank you.

## 2020-03-21 NOTE — Telephone Encounter (Signed)
Message forwarded to [facility] authorization department.

## 2020-03-21 NOTE — Telephone Encounter (Signed)
Stormie calling from [employer] to request a peer to peer for a workers comp case. Please have Dr. Bea Laura call [phone] and ask to speak to Dr. Audelia Hives.

## 2020-03-25 ENCOUNTER — Telehealth: Payer: Self-pay | Admitting: Ophthalmology

## 2020-03-25 NOTE — Telephone Encounter (Signed)
Patient requesting a call back from clinic to discuss her medication.  Informed she needs a refill on her eye cream but could not provide medication name.    Please contact pt back to further discuss.  Requesting for prescription to go through [facility] in [place], [place].    Thank you.

## 2020-03-25 NOTE — Telephone Encounter (Signed)
Called pt has not started ointment informed can purchase over the counter. pt wants reassurance she can start ointment by Dr.E    White Petrolatum-Mineral Oil (ARTIFICIAL TEARS) ointment    Place 1 Application into left eye at bedtime for 14 days., Disp-1 g, R-11, ePrescribe

## 2020-03-31 NOTE — Telephone Encounter (Signed)
Spoke to patient gave her md's recommendations      Natalie Brand, MD  Natalie Webb 18 hours ago (4:05 PM)         Yes that is correct and preservative free artificial tears 4 x daily

## 2020-04-04 ENCOUNTER — Ambulatory Visit: Payer: Worker's Comp, Other unspecified | Admitting: Retina Specialist

## 2020-04-06 ENCOUNTER — Ambulatory Visit: Payer: Worker's Comp, Other unspecified | Admitting: Ophthalmology

## 2020-04-07 ENCOUNTER — Ambulatory Visit: Payer: Worker's Comp, Other unspecified | Admitting: Retina Specialist

## 2020-04-13 ENCOUNTER — Telehealth: Payer: Self-pay | Admitting: Retina Specialist

## 2020-04-13 DIAGNOSIS — H33052 Total retinal detachment, left eye: Secondary | ICD-10-CM

## 2020-04-13 NOTE — Telephone Encounter (Signed)
Patient states requesting renewal for prescription for eye drops, pharmacy is [facility], please contact patient to assist. Patient speaks spanish. Thank you.

## 2020-04-13 NOTE — Telephone Encounter (Signed)
PA SUBMITTED TODAY VIA FAX

## 2020-04-14 MED ORDER — PREDNISOLONE ACETATE 1 % OP SUSP
1.0000 [drp] | Freq: Four times a day (QID) | OPHTHALMIC | 3 refills | Status: DC
Start: 2020-04-14 — End: 2020-08-11

## 2020-04-14 NOTE — Telephone Encounter (Signed)
Rx sent 

## 2020-04-20 NOTE — Telephone Encounter (Signed)
PA APPROVED FOR PF 11 REFILLS

## 2020-04-25 ENCOUNTER — Telehealth: Payer: Self-pay | Admitting: Glaucoma Specialist

## 2020-04-25 NOTE — Telephone Encounter (Signed)
Darel Hong from the law offices of [facility] is requesting to speak with Maxine Glenn to follow up on their previous conversation regarding the letter of agreement. She wants to make sure Maxine Glenn was able to reach the insurance to obtain the letter.   Please call Darel Hong , ok to leave a detailed msg  Thanks you

## 2020-04-27 NOTE — Telephone Encounter (Signed)
Monica spoke with Darel Hong

## 2020-05-12 ENCOUNTER — Encounter: Payer: Self-pay | Admitting: Retina Specialist

## 2020-05-12 ENCOUNTER — Ambulatory Visit: Payer: Worker's Comp, Other unspecified | Attending: Retina Specialist | Admitting: Retina Specialist

## 2020-05-12 DIAGNOSIS — H33052 Total retinal detachment, left eye: Secondary | ICD-10-CM | POA: Insufficient documentation

## 2020-05-12 DIAGNOSIS — S0532XA Ocular laceration without prolapse or loss of intraocular tissue, left eye, initial encounter: Secondary | ICD-10-CM | POA: Insufficient documentation

## 2020-05-12 DIAGNOSIS — H5462 Unqualified visual loss, left eye, normal vision right eye: Secondary | ICD-10-CM | POA: Insufficient documentation

## 2020-05-12 NOTE — Progress Notes (Signed)
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 Erroll Luna)    #s/p PPV/MP/Retinectomy/EL/AfX/SO & Choroidal Drainage OS (Date 03/26/18)  - Original plan to keep oil for 6 months, however worsening ERM now with TRD     # s/p PPV/SOR/MP/EL/SOI (Date 08/13/18 by Riazi/Puri/Clark) for TRD OS  OD: 20/25, VMA, good foveal contour, no RT/RD  Observe       OS: XT, HM, mild cornal cloudiness, apakia, aniridia, SO not emulsified, 360 retinectomy, retina flat with some translocation of macula towards inferior and atrophy same as before, mild ERM, no heme, IOP 11 mmHg  Plan: Observe, considering SOR in the near future    Cont Pred forte BID    #Headache  - splitting headache since trauma to left eye  - mildly helped with Tylenol  - no clear ocular explanation  She feels that it got worse  Already referred to Neurology  Plan: Consult for another time     #Glaucoma suspect based on C/D ratio  - unknown FHx  - IOP WNL in OD  - OCT nfl full OD  - HVF OD with high total deviation, nonspecific pattern deficits  - 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 Juel Burrow    # Ptosis of left eyelid  # s/p LELA 01/06/2020 by Dr Lorna Dibble:   h/o prior trauma and multiple prior surgeries  - healing well, improved MRD1  - PFATs QID prn  - no eye rubbing/sleeping on face  - photography consent signed 03/02/2020  - all q answered, interpreter used    _______________________________________   Extended Ophthalmoscopy 05/12/20  by slit lamp and indirect methods reveals:    Main Ophthalmology Exam     External Exam       Right Left    External pigmented skin on R brow periorbital edema          Slit Lamp  Exam       Right Left    Lids/Lashes Normal for Age healing well, tr edema, improved MRD1    Conjunctiva/Sclera White and quiet, no pigmentary changes White and quiet    Cornea Clear nasal haze extending 5.7 mm from limbus (prior incision); D tear nasally vs. epithelial downgrowth nasally?    Anterior Chamber Deep and quiet tr cell/pigment    Iris Round, no rubeosis no visible iris tissue    Lens Clear aphakia    Anterior Vitreous Clear SO not emulsified          Fundus Exam       Right Left    Disc good rim, no disc heme perfused    C/D Ratio 0.7 0.3    Macula Normal contour and reflex for age Flat retina, no heme, faint ERM, atrophy, no CME, atrophic retina, macular ectopia inferiorly, fibrosis AT THE RETINECTOMY SITE, no heme same as before    Vessels Normal Normal    Periphery Flat and attached 360 degrees 360 retinectomy w laser, membrane superiorly with trace traction but no change in terms of detachment or breaks, no heme, fibrosis in the border             <div id="MAIN_EXAM_REVIEWED"></div>     -  See Assesment/plan above    Follow-up plan:  Riazi  OCTm Fundus photo FA ICG FAF OCT  RNFL Bscan Other  Injection Laser   3 Months  ou                 I reviewed and confirmed all history components, including the HPI; and any other elements performed by the technician. I performed the key and critical portions of the exam. The final examination findings, image interpretations, and plan as documented in the record represent my personal judgement and conclusion.     Leola Brazil MD.

## 2020-05-13 ENCOUNTER — Telehealth: Payer: Self-pay | Admitting: Ophthalmology

## 2020-05-13 NOTE — Telephone Encounter (Signed)
Spoke to [place] and answered questions in regards to PFAT.

## 2020-05-13 NOTE — Telephone Encounter (Signed)
Arlys John from workers comp calling to verified the quantity on the medication requested. Artificial tears, please call back to assist at [phone]

## 2020-06-01 ENCOUNTER — Telehealth: Payer: Self-pay | Admitting: Retina Specialist

## 2020-06-01 NOTE — Telephone Encounter (Signed)
Received a call from Elease Hashimoto from patient's wc claim asking for a work status

## 2020-06-08 ENCOUNTER — Ambulatory Visit: Payer: Worker's Comp, Other unspecified | Attending: Ophthalmology | Admitting: Ophthalmology

## 2020-06-08 ENCOUNTER — Encounter: Payer: Self-pay | Admitting: Ophthalmology

## 2020-06-08 DIAGNOSIS — H5712 Ocular pain, left eye: Secondary | ICD-10-CM | POA: Insufficient documentation

## 2020-06-08 DIAGNOSIS — H547 Unspecified visual loss: Secondary | ICD-10-CM | POA: Insufficient documentation

## 2020-06-08 DIAGNOSIS — S0532XA Ocular laceration without prolapse or loss of intraocular tissue, left eye, initial encounter: Secondary | ICD-10-CM | POA: Insufficient documentation

## 2020-06-08 DIAGNOSIS — H538 Other visual disturbances: Secondary | ICD-10-CM | POA: Insufficient documentation

## 2020-06-08 DIAGNOSIS — Z09 Encounter for follow-up examination after completed treatment for conditions other than malignant neoplasm: Secondary | ICD-10-CM | POA: Insufficient documentation

## 2020-06-08 DIAGNOSIS — H5010 Unspecified exotropia: Secondary | ICD-10-CM | POA: Insufficient documentation

## 2020-06-08 DIAGNOSIS — H02402 Unspecified ptosis of left eyelid: Secondary | ICD-10-CM | POA: Insufficient documentation

## 2020-06-08 MED ORDER — HOMATROPINE HBR 5 % OP SOLN
1.0000 [drp] | Freq: Two times a day (BID) | OPHTHALMIC | 2 refills | Status: AC
Start: 2020-06-08 — End: ?

## 2020-06-08 NOTE — Progress Notes (Signed)
See "Detailed Report" under my note in the Encounter tab for more details regarding today's visit and eye exam.    I reviewed and confirmed the techs HPI, review of systems, past histories, and readings.    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 She complains that her eye deviates outwards, and she has difficulty reading with her right eye.    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    RTC 4-6 mo or PRN    Return and warning precautions discussed.      SIGNED: Nadean Corwin, MD

## 2020-06-10 ENCOUNTER — Telehealth: Payer: Self-pay | Admitting: Ophthalmology

## 2020-06-10 NOTE — Telephone Encounter (Signed)
Spanish speaking patient is calling she needs you to request authorization for the medication that you prescribed on 05 25 2022 to her insurance.please call pt when you have done so.

## 2020-06-15 NOTE — Telephone Encounter (Signed)
*  SPANISH SPEAKER*  Pt is calling again regarding message below. Pt does not know the name of the medication but states it was a new precription done on 06/08/2020. Patient is requesting a call back to give her an update. Please assist, thank you

## 2020-06-16 ENCOUNTER — Telehealth: Payer: Self-pay | Admitting: Ophthalmology

## 2020-06-16 NOTE — Telephone Encounter (Signed)
FAXED PA TODAY - CALLED PATIENT TO LET HER KNOW

## 2020-06-16 NOTE — Telephone Encounter (Signed)
LEFT MESSAGE FOR PATIENT TO CALL BACK TO SCHEDULE NEXT AVAIL WITH DR [facility] PER DR EDIRIWICKREMA. PER DR SUH HELP SCREEN HE DOES ACCEPT WORKERS COMP. DR. Emeline Darling DOES NOT ACCEPT WORKERS COMP SO PATIENT CANNOT BE SCHEDULED WITH HER. EMAILED DR Emeline Darling TO SEE IF SHE WOULD MAKE EXCEPTION PER DR GORE SHE WILL NOT TAKE ON CASE.

## 2020-06-28 NOTE — Telephone Encounter (Signed)
PA approved for Homatropine. Called pt and told her of approval and pharmacy to be able to dispense. Pt states pharmacy already called her.

## 2020-06-28 NOTE — Telephone Encounter (Signed)
Pt is not ready to have sx for strabismus, and does not feel the need to schedule with Dr. Polo Riley at the moment.

## 2020-07-22 ENCOUNTER — Telehealth: Payer: Self-pay | Admitting: Retina Specialist

## 2020-07-22 ENCOUNTER — Encounter: Payer: Self-pay | Admitting: Student in an Organized Health Care Education/Training Program

## 2020-07-22 DIAGNOSIS — H33052 Total retinal detachment, left eye: Secondary | ICD-10-CM

## 2020-07-22 NOTE — Telephone Encounter (Signed)
Warm transferred call to the [facility] [place] [facility]. Dr Vonna Drafts calling to request to speak with the Dr regarding the pt's workers comp case.  Spoke with Rowe Robert at the clinic. Thanks.

## 2020-07-22 NOTE — Telephone Encounter (Signed)
Dr Lavell Islam not available to speak MD spoke to Dr. Janee Morn

## 2020-07-22 NOTE — Progress Notes (Signed)
Received note from Dr. Vonna Drafts saying Natalie Webb not valid for next Retina appointment for extended ophthalmoscopy. Will request auths for OCTm and fundus photos instead.

## 2020-08-08 ENCOUNTER — Telehealth: Payer: Self-pay | Admitting: Retina Specialist

## 2020-08-08 NOTE — Telephone Encounter (Signed)
Dr. Gae Dry ophthalmology is requesting to speak to Dr. Lavell Islam or he's nurse regarding a peer to peer for this patient. Dr. Claudie Leach is requesting a call back today, he's phone number is [phone]    Please assist, thank you.

## 2020-08-11 ENCOUNTER — Ambulatory Visit: Payer: Worker's Comp, Other unspecified | Attending: Retina Specialist | Admitting: Retina Specialist

## 2020-08-11 ENCOUNTER — Encounter: Payer: Self-pay | Admitting: Retina Specialist

## 2020-08-11 DIAGNOSIS — H33052 Total retinal detachment, left eye: Secondary | ICD-10-CM | POA: Insufficient documentation

## 2020-08-11 DIAGNOSIS — H5462 Unqualified visual loss, left eye, normal vision right eye: Secondary | ICD-10-CM | POA: Insufficient documentation

## 2020-08-11 DIAGNOSIS — H02402 Unspecified ptosis of left eyelid: Secondary | ICD-10-CM

## 2020-08-11 DIAGNOSIS — S0532XA Ocular laceration without prolapse or loss of intraocular tissue, left eye, initial encounter: Secondary | ICD-10-CM | POA: Insufficient documentation

## 2020-08-11 MED ORDER — PREDNISOLONE ACETATE 1 % OP SUSP
1.0000 [drp] | Freq: Three times a day (TID) | OPHTHALMIC | 3 refills | Status: DC
Start: 2020-08-11 — End: 2020-10-26

## 2020-08-11 NOTE — Progress Notes (Signed)
 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

## 2020-08-15 NOTE — Telephone Encounter (Signed)
Spoke to Dr. Claudie Leach we had to call within 24 hrs the case is closed they will let us know the determination of the case by fax.

## 2020-08-24 ENCOUNTER — Telehealth: Payer: Self-pay | Admitting: Ophthalmology

## 2020-08-24 NOTE — Telephone Encounter (Signed)
TECH WILL CALL BACK TO VERIFY MEDICATION AND SEND ALT PER PROVIDER

## 2020-08-24 NOTE — Telephone Encounter (Signed)
Darel Hong calling from the [employer] requesting to speak to [place]. I called front desk and transferred the call for her to hold for assistance.

## 2020-08-30 ENCOUNTER — Telehealth: Payer: Self-pay | Admitting: Ophthalmology

## 2020-08-30 NOTE — Telephone Encounter (Signed)
Darel Hong from [employer] offices called regarding RX Homatropine medication for patient.    They have called around and the medication has been discontinued by multiple pharmacies    Wants to know if something else can prescribed or doctor can let them know where to get the RX     Fax [phone]    Please assist

## 2020-09-05 MED ORDER — ATROPINE SULFATE 1 % OP SOLN
1.0000 [drp] | Freq: Every evening | OPHTHALMIC | 0 refills | Status: DC
Start: 2020-09-05 — End: 2020-10-24

## 2020-09-06 ENCOUNTER — Encounter: Payer: Self-pay | Admitting: Ophthalmology

## 2020-09-06 NOTE — Telephone Encounter (Signed)
Medication Comment/question     [name], MD  You 18 hours ago (3:21 PM)         done    Message text        You  Ediriwickrema, Olene Floss, MD Yesterday (8:22 AM)     SG      Please send the rx prescription to the pharmacy.    Message text        [name], MD  You 5 days ago         Atropine QD

## 2020-09-08 ENCOUNTER — Telehealth: Payer: Self-pay | Admitting: Ophthalmology

## 2020-09-08 NOTE — Telephone Encounter (Signed)
 Spoke to judy MD sent the new prescription;    atropine (ISOPTO) 1 % ophthalmic solution  Medication  Date: 09/05/2020 Department: [facility] Ordering/Authorizing: [name], MD       Order Providers    Prescribing Provider

## 2020-09-08 NOTE — Telephone Encounter (Signed)
 3rd call (8/10 and 8/16) : Darel Hong from [place] following up on previous messages regarding medication that has been discontinued would like to know if there is an alternative that can be prescribed. Transferred to Byrd Hesselbach, will check if tech is av

## 2020-10-18 ENCOUNTER — Other Ambulatory Visit: Payer: Self-pay | Admitting: Ophthalmology

## 2020-10-19 ENCOUNTER — Ambulatory Visit: Payer: Worker's Comp, Other unspecified | Admitting: Ophthalmology

## 2020-10-24 MED ORDER — ATROPINE SULFATE 1 % OP SOLN
OPHTHALMIC | 1 refills | Status: AC
Start: 2020-10-24 — End: ?

## 2020-10-26 ENCOUNTER — Encounter: Payer: Self-pay | Admitting: Ophthalmology

## 2020-10-26 ENCOUNTER — Ambulatory Visit: Payer: Worker's Comp, Other unspecified | Attending: Ophthalmology | Admitting: Ophthalmology

## 2020-10-26 DIAGNOSIS — H538 Other visual disturbances: Secondary | ICD-10-CM | POA: Insufficient documentation

## 2020-10-26 DIAGNOSIS — H33052 Total retinal detachment, left eye: Secondary | ICD-10-CM

## 2020-10-26 DIAGNOSIS — H02402 Unspecified ptosis of left eyelid: Secondary | ICD-10-CM

## 2020-10-26 MED ORDER — PREDNISOLONE ACETATE 1 % OP SUSP
1.0000 [drp] | Freq: Four times a day (QID) | OPHTHALMIC | 3 refills | Status: DC
Start: 2020-10-26 — End: 2020-12-22

## 2020-10-26 MED ORDER — PREDNISOLONE ACETATE 1 % OP SUSP
1.0000 [drp] | Freq: Three times a day (TID) | OPHTHALMIC | 3 refills | Status: DC
Start: 2020-10-26 — End: 2020-12-22

## 2020-10-26 NOTE — Progress Notes (Signed)
 See "Detailed Report" under my note in the Encounter tab for more details regarding today's visit and eye exam.    I reviewed and confirmed the techs HPI, review of systems, past histories, and readings.    Assessment/Plan    Natalie Webb is a pleasant45 y

## 2020-11-07 ENCOUNTER — Telehealth: Payer: Self-pay | Admitting: Ophthalmology

## 2020-11-07 NOTE — Telephone Encounter (Signed)
Patient requesting records from 10.12.2022 be faxed to her lawyer    Will call back with fax number did not have it on her

## 2020-11-10 NOTE — Telephone Encounter (Signed)
 Patient calling back to provide the fax number that she needs her last visit clinical notes to be sent to. Patient also states that because she has workers comp the clinic needs to send and authorization request to the [facility] insurance to approved the [employer]

## 2020-11-15 NOTE — Telephone Encounter (Signed)
Sent the RFA form today both fax numbers adjuster and Francesco Runner

## 2020-11-21 ENCOUNTER — Telehealth: Payer: Self-pay | Admitting: Ophthalmology

## 2020-11-21 NOTE — Telephone Encounter (Signed)
Daulton from [employer] is asking to arrange for a doctor to doctor. He would like to have Dr Lorna Dibble call Dr. Graylon Gunning, Agnes Lawrence for a peer-to-peer regarding the pts care. Please follow up and call back to set peer-to-peer. Thank you.

## 2020-11-23 NOTE — Telephone Encounter (Signed)
Called to schedule a peer to peer - Daulton from [employer] [phone]     Spoke to [place] - we can call the doctor directly to do the peer to peer [phone] Dr. Graylon Gunning     Ref# [id number] - case manger Byrd Hesselbach [phone]

## 2020-11-24 NOTE — Telephone Encounter (Signed)
PA APPROVED FOR PF

## 2020-12-01 NOTE — Telephone Encounter (Signed)
 You  [name], MD 7 days ago     SG    It's regarding her follow ups.       [name], MD  You 8 days ago     I apologize I cant call unless I have more information please do more evaluation thanks!       You  Natalie Webb,

## 2020-12-22 ENCOUNTER — Encounter: Payer: Self-pay | Admitting: Retina Specialist

## 2020-12-22 ENCOUNTER — Ambulatory Visit: Payer: Worker's Comp, Other unspecified | Attending: Retina Specialist | Admitting: Retina Specialist

## 2020-12-22 DIAGNOSIS — S0532XA Ocular laceration without prolapse or loss of intraocular tissue, left eye, initial encounter: Secondary | ICD-10-CM | POA: Insufficient documentation

## 2020-12-22 DIAGNOSIS — H5462 Unqualified visual loss, left eye, normal vision right eye: Secondary | ICD-10-CM | POA: Insufficient documentation

## 2020-12-22 DIAGNOSIS — S0532XD Ocular laceration without prolapse or loss of intraocular tissue, left eye, subsequent encounter: Secondary | ICD-10-CM | POA: Insufficient documentation

## 2020-12-22 DIAGNOSIS — H33052 Total retinal detachment, left eye: Secondary | ICD-10-CM | POA: Insufficient documentation

## 2020-12-22 MED ORDER — PREDNISOLONE ACETATE 1 % OP SUSP
1.0000 [drp] | Freq: Three times a day (TID) | OPHTHALMIC | 11 refills | Status: DC
Start: 2020-12-22 — End: 2022-07-23

## 2020-12-22 NOTE — Addendum Note (Signed)
Addended by: Lavinia Sharps, Gwenyth Bouillon on: 12/22/2020 08:54 AM     Modules accepted: Orders

## 2020-12-22 NOTE — Progress Notes (Signed)
 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

## 2021-03-13 NOTE — Progress Notes (Signed)
 ICD-10-CM ICD-9-CM   1. 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 Lavell Islam.   -Family history of glaucoma? none  -Prior eye trauma? Yes, OS, injury at work with spinning part from printer hitting left eye     -Non-glaucoma oc

## 2021-03-15 ENCOUNTER — Encounter: Payer: Self-pay | Admitting: Glaucoma Specialist

## 2021-03-15 ENCOUNTER — Ambulatory Visit: Payer: Worker's Comp, Other unspecified | Attending: Glaucoma Specialist | Admitting: Glaucoma Specialist

## 2021-03-15 DIAGNOSIS — H40001 Preglaucoma, unspecified, right eye: Secondary | ICD-10-CM | POA: Insufficient documentation

## 2021-03-15 DIAGNOSIS — H40002 Preglaucoma, unspecified, left eye: Secondary | ICD-10-CM

## 2021-04-27 ENCOUNTER — Ambulatory Visit: Payer: Worker's Comp, Other unspecified | Attending: Retina Specialist | Admitting: Retina Specialist

## 2021-04-27 ENCOUNTER — Encounter: Payer: Self-pay | Admitting: Retina Specialist

## 2021-04-27 DIAGNOSIS — H33052 Total retinal detachment, left eye: Secondary | ICD-10-CM | POA: Insufficient documentation

## 2021-04-27 DIAGNOSIS — H5462 Unqualified visual loss, left eye, normal vision right eye: Secondary | ICD-10-CM | POA: Insufficient documentation

## 2021-04-27 DIAGNOSIS — S0532XA Ocular laceration without prolapse or loss of intraocular tissue, left eye, initial encounter: Secondary | ICD-10-CM | POA: Insufficient documentation

## 2021-04-27 DIAGNOSIS — H02402 Unspecified ptosis of left eyelid: Secondary | ICD-10-CM | POA: Insufficient documentation

## 2021-04-27 NOTE — Progress Notes (Signed)
Chief Complaint   Patient presents with   . Retinal Detachment     Past Medical History:   Diagnosis Date   . Retinal detachment      _______________________________________      ICD-10-CM ICD-9-CM   1. Ruptured globe of left eye, initial encounter  S05.32XA 871.0   2. Total retinal detachment of left eye  H33.052 361.05   3. Decreased vision of left eye  H54.62 369.8   4. Ptosis of left eyelid  H02.402 374.30     Chief Complaint   Patient presents with   . Retinal Detachment     Past Medical History:   Diagnosis Date   . Retinal detachment      _______________________________________      ICD-10-CM ICD-9-CM   1. Ruptured globe of left eye, initial encounter  S05.32XA 871.0   2. Total retinal detachment of left eye  H33.052 361.05   3. Decreased vision of left eye  H54.62 369.8   4. 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 Erroll Luna)    #s/p PPV/MP/Retinectomy/EL/AfX/SO & Choroidal Drainage OS (Date 03/26/18)  - Original plan to keep oil for 6 months, however worsening ERM now with TRD     # s/p PPV/SOR/MP/EL/SOI (Date 08/13/18 by Riazi/Puri/Clark) for TRD OS  OD: 20/20, VMA, good foveal contour, no RT/RD  Observe       OS: XT, HM, mild cornal cloudiness and band keratopathy, apakia, aniridia, 360 retinectomy, retina flat with some translocation of macula towards inferior and atrophy same as before, mild ERM, no heme, IOP 5 mmHg  No SO emulsification  Plan: Observe, considering SOR in the near future    Cont Pred forte TID    #Headache  - splitting headache since  trauma to left eye  - mildly helped with Tylenol  - no clear ocular explanation  She feels that it got worse  Already referred to Neurology  Plan: Consult for another time     #Glaucoma suspect based on C/D ratio  - unknown FHx  - IOP WNL in OD  - OCT nfl full OD  - HVF OD with high total deviation, nonspecific pattern deficits  - 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 Juel Burrow    # Ptosis of left eyelid  # s/p LELA 01/06/2020 by Dr Lorna Dibble:   h/o prior trauma and multiple prior surgeries  - healing well, improved MRD1    _______________________________________   Extended Ophthalmoscopy 04/27/21  by slit lamp and indirect methods reveals:    Base Eye Exam     Visual Acuity (Snellen - Linear)       Right Left    Dist sc 20/20 20/hm          Tonometry (Tonopen, 8:33 AM)       Right Left    Pressure 14 05          Pupils  Dark Light    Right 4 2.5    Left no view           Neuro/Psych     Oriented x3: Yes    Mood/Affect: Normal          Dilation     Right eye: 1.0% Mydriacyl, 2.5% Neo Synephrine @ 8:34 AM            Slit Lamp and Fundus Exam     External Exam       Right Left    External pigmented skin on R brow Normal          Slit Lamp Exam       Right Left    Lids/Lashes Normal for Age healing well, tr edema, improved MRD1    Conjunctiva/Sclera White and quiet, no pigmentary changes White and quiet    Cornea Clear Band keratopathy, nasal haze extending 5.7 mm from limbus (prior incision); D tear nasally vs. epithelial downgrowth nasally?    Anterior Chamber Deep and quiet tr cell/pigment    Iris Round, no rubeosis no visible iris tissue    Lens Clear aphakia    Anterior Vitreous Clear SO not emulsified          Fundus Exam       Right Left    Disc good rim, no disc heme hazy view    C/D Ratio 0.7 0.3    Macula Normal contour and reflex for age, no RT/RD Flat retina, no heme, faint ERM, atrophy, no CME, atrophic  retina, macular ectopia inferiorly, fibrosis AT THE RETINECTOMY SITE, no heme same as before    Vessels Normal Normal    Periphery Flat and attached 360 degrees 360 retinectomy w laser, membrane superiorly with trace traction but no change in terms of detachment or breaks, no heme, fibrosis in the border same as before              - See Assesment/plan above    Follow-up plan:  Riazi  OCTm Fundus photo FA ICG FAF OCT  RNFL Bscan Other  Injection Laser   4 Months  ou                 I reviewed and confirmed the techs ROS, past histories, and readings.  I saw and examined the patient and reviewed in detail the findings of the technician/resident/fellow and edited it as needed to conform with my findings and impression and plan. The final examination findings, image interpretations, and plan as documented in the record represent my personal judgment and conclusions.    I have reviewed the medications    Based on todays exam, diagnostic studies, and review of records, the determination was made for or against any treatments listed above in the plan.    Dorann Lodge MD.

## 2021-08-24 ENCOUNTER — Ambulatory Visit: Payer: Worker's Comp, Other unspecified | Attending: Retina Specialist | Admitting: Retina Specialist

## 2021-08-24 ENCOUNTER — Encounter: Payer: Self-pay | Admitting: Retina Specialist

## 2021-08-24 DIAGNOSIS — S0532XA Ocular laceration without prolapse or loss of intraocular tissue, left eye, initial encounter: Secondary | ICD-10-CM | POA: Insufficient documentation

## 2021-08-24 DIAGNOSIS — H33052 Total retinal detachment, left eye: Secondary | ICD-10-CM | POA: Insufficient documentation

## 2021-08-24 DIAGNOSIS — H5462 Unqualified visual loss, left eye, normal vision right eye: Secondary | ICD-10-CM | POA: Insufficient documentation

## 2021-08-24 NOTE — Progress Notes (Signed)
Natalie Webb is a 46 yo female present for a 4 month follow-up with a history of Ruptured globe OS, total retinal detachment OS, and ptosis OS. Patient reports her left eye feels heavy and occasional headaches. Notes vision is stable. States doing well. Denies any eye pain, discomfort, headaches, or flashes/floaters today.     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 Erroll Luna)    #s/p PPV/MP/Retinectomy/EL/AfX/SO & Choroidal Drainage OS (Date 03/26/18)  - Original plan to keep oil for 6 months, however worsening ERM now with TRD     # s/p PPV/SOR/MP/EL/SOI (Date 08/13/18 by Riazi/Puri/Clark) for TRD OS  OD: 20/20, VMA, good foveal contour, no RT/RD  Observe       OS: XT, HM, mild cornal cloudiness and band keratopathy, apakia, aniridia, 360 retinectomy, retina flat with some translocation of macula towards inferior and atrophy same as before, mild ERM, no heme, IOP 10 mmHg  No SO emulsification  Plan: Observe, considering SOR in the near future in case of being stable for a while and consider possible PKP at the same time    Cont Pred forte BID    #Headache  - splitting headache since trauma to left eye  - mildly helped with Tylenol  - no clear ocular explanation  She feels that it got worse  Already referred to Neurology  Plan: Consult for another time     #Glaucoma suspect based on C/D ratio  - unknown FHx  - IOP WNL in OD  - OCT nfl full OD  - HVF OD with high total deviation, nonspecific pattern deficits  - 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 Juel Burrow    # Ptosis of left eyelid  # s/p LELA 01/06/2020 by Dr Lorna Dibble:   h/o prior trauma and multiple prior surgeries  - healing well, improved MRD1    _________________________________________   Extended Ophthalmoscopy 08/24/21  by slit lamp and indirect methods reveals:    Base Eye Exam       Visual Acuity (Snellen - Linear)         Right Left    Dist sc 20/20 -2 HM    Dist ph sc  NI              Tonometry (Tonopen, 8:21 AM)         Right Left    Pressure 14 10              Pupils         Dark Light    Right 4 2.5    Left no view               Neuro/Psych       Oriented x3: Yes    Mood/Affect: Normal              Dilation       Both eyes: 1.0% Mydriacyl,  2.5% Neo Synephrine @ 8:21 AM                  Slit Lamp and Fundus Exam       External Exam         Right Left    External pigmented skin on R brow Normal              Slit Lamp Exam         Right Left    Lids/Lashes Normal for Age healing well, tr edema, improved MRD1    Conjunctiva/Sclera White and quiet, no pigmentary changes White and quiet    Cornea Clear Band keratopathy, nasal haze extending 5.7 mm from limbus (prior incision); D tear nasally vs. epithelial downgrowth nasally?    Anterior Chamber Deep and quiet tr cell/pigment    Iris Round, no rubeosis no visible iris tissue    Lens Clear aphakia    Anterior Vitreous Clear SO not emulsified              Fundus Exam         Right Left    Disc good rim, no disc heme hazy view    C/D Ratio 0.7 0.3    Macula Normal contour and reflex for age, no RT/RD Flat retina, no heme, faint ERM, atrophy, no CME, atrophic retina, macular ectopia inferiorly, fibrosis AT THE RETINECTOMY SITE, no heme same as before    Vessels Normal Normal    Periphery Flat and attached 360 degrees 360 retinectomy w laser, membrane superiorly with trace traction but no change in terms of detachment or breaks, no heme, fibrosis in the border same as before                   - See Assesment/plan above    Follow-up plan:  Riazi  OCTm Fundus photo FA ICG FAF OCT  RNFL Bscan Other  Injection Laser   3 Months  ou                 I reviewed and confirmed the techs ROS, past histories, and readings.  I saw and examined the patient and reviewed in detail the findings of the technician/resident/fellow and edited it as needed to conform with my findings and impression and plan. The final examination findings, image interpretations, and plan as documented in the record represent my personal judgment and conclusions.    I have reviewed the medications    Based on todays exam, diagnostic studies, and review of records, the determination was made for or against any treatments listed above in the plan.    Dorann Lodge MD.

## 2021-08-30 ENCOUNTER — Telehealth: Payer: Self-pay | Admitting: Retina Specialist

## 2021-08-30 NOTE — Telephone Encounter (Signed)
Proofreader (Client Coordinator)-from [employer]- is calling asking for Dr. Lavell Islam to call  Dr. Arman Filter- Reviewing Physician for pt case for a Peer to Peer. She stating this needs to be done by Friday 10am. Thank you    Dr. Arman Filter  [phone]

## 2021-08-30 NOTE — Telephone Encounter (Signed)
Peer to peer complete with Dr. Vonna Drafts.   Case has been submitted by Dr. Vonna Drafts per phone call.   No further action required.

## 2021-10-31 ENCOUNTER — Encounter: Payer: Self-pay | Admitting: Ophthalmology

## 2021-10-31 ENCOUNTER — Ambulatory Visit: Payer: Worker's Comp, Other unspecified | Attending: Ophthalmology | Admitting: Ophthalmology

## 2021-10-31 DIAGNOSIS — H179 Unspecified corneal scar and opacity: Secondary | ICD-10-CM | POA: Insufficient documentation

## 2021-10-31 DIAGNOSIS — H02402 Unspecified ptosis of left eyelid: Secondary | ICD-10-CM | POA: Insufficient documentation

## 2021-10-31 DIAGNOSIS — H547 Unspecified visual loss: Secondary | ICD-10-CM | POA: Insufficient documentation

## 2021-10-31 DIAGNOSIS — S0532XS Ocular laceration without prolapse or loss of intraocular tissue, left eye, sequela: Secondary | ICD-10-CM | POA: Insufficient documentation

## 2021-10-31 DIAGNOSIS — H5010 Unspecified exotropia: Secondary | ICD-10-CM | POA: Insufficient documentation

## 2021-10-31 DIAGNOSIS — H33052 Total retinal detachment, left eye: Secondary | ICD-10-CM | POA: Insufficient documentation

## 2021-10-31 NOTE — Progress Notes (Signed)
See "Detailed Report" under my note in the Encounter tab for more details regarding today's visit and eye exam.    I reviewed and confirmed the techs HPI, review of systems, past histories, and readings.    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 She complains that her eye deviates outwards and her vision has deteriorated in her left eye in the past few months.     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     Cornea referral  RTC 6-12 mo or PRN    Return and warning precautions discussed.

## 2021-11-01 ENCOUNTER — Ambulatory Visit: Payer: Worker's Comp, Other unspecified | Admitting: Ophthalmology

## 2021-11-15 ENCOUNTER — Ambulatory Visit: Payer: Worker's Comp, Other unspecified | Attending: Ophthalmology | Admitting: Ophthalmology

## 2021-11-15 ENCOUNTER — Encounter: Payer: Self-pay | Admitting: Ophthalmology

## 2021-11-15 DIAGNOSIS — Q158 Other specified congenital malformations of eye: Secondary | ICD-10-CM | POA: Insufficient documentation

## 2021-11-15 DIAGNOSIS — H18422 Band keratopathy, left eye: Secondary | ICD-10-CM | POA: Insufficient documentation

## 2021-11-15 DIAGNOSIS — Q133 Congenital corneal opacity: Secondary | ICD-10-CM | POA: Insufficient documentation

## 2021-11-15 DIAGNOSIS — H179 Unspecified corneal scar and opacity: Secondary | ICD-10-CM | POA: Insufficient documentation

## 2021-11-15 MED ORDER — CARBOXYMETHYLCELLULOSE SOD PF 0.5 % OP SOLN
1.0000 [drp] | Freq: Four times a day (QID) | OPHTHALMIC | 11 refills | Status: AC
Start: 2021-11-15 — End: ?

## 2021-11-15 MED ORDER — CARBOXYMETHYLCELL-HYPROMELLOSE 0.25-0.3 % OP GEL
1.0000 | Freq: Every evening | OPHTHALMIC | 11 refills | Status: AC
Start: 2021-11-15 — End: ?

## 2021-11-15 NOTE — Progress Notes (Signed)
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 Chauncey Reading MD, for Stromal Haze/Scar OS Hx of eye trauma at work "rotating device at [employer] at work entered eye" (2020) OS, has had 6 eye sx. C/o mild eye pain when touched OS, headaches, decreased peripheral vision, and occasional blurry vision OD.   Drops: Pred. BID OS and Lubricating eye drop     PMH:  Open globe injury 2020  H/o TRD     Assessment and Plan:    Corneal opacity and band K OS    Prior surgery for open globe injury and sequelae including TRD and funnel RD in 2020    Reviewed prior operative reports that describe severe course of disease since accident with open globe injury in 2020, including 2 subsequent surgeries for retinal detachment  Currently there is SO still in the eye. Per last retina note, there is possibility of additional retina surgery, SO removal and concurrent combined PKP OS. Patient is not interested in further surgery, especially since she has poor visual potential and has never seen better than HM (she says "shadows" but now "less shadows") since the initial injury.    As for discomfort OS, may be due to band K. There is no epi defect over band K. Suggest lubrication OS. Suggest switch to PFATs. Informed pt of FDA recall of the type of drops she is using. Prescribed Celluvisc and Genteal gel (escripted) for OS.      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 I reviewed and confirmed the technicians' ROS, past histories, and readings.  I saw and examined the patient, and reviewed all relevant and available imaging, labs, and historical notes. The final examination findings, image interpretations, and plan as documented in the record represent my personal judgment and conclusions.     Manuella Ghazi, MD  Associate Clinical Professor of Ophthalmology

## 2021-11-23 ENCOUNTER — Ambulatory Visit: Payer: Worker's Comp, Other unspecified | Attending: Retina Specialist | Admitting: Retina Specialist

## 2021-11-23 ENCOUNTER — Encounter: Payer: Self-pay | Admitting: Retina Specialist

## 2021-11-23 DIAGNOSIS — H5462 Unqualified visual loss, left eye, normal vision right eye: Secondary | ICD-10-CM | POA: Insufficient documentation

## 2021-11-23 DIAGNOSIS — H33052 Total retinal detachment, left eye: Secondary | ICD-10-CM | POA: Insufficient documentation

## 2021-11-23 DIAGNOSIS — S0532XA Ocular laceration without prolapse or loss of intraocular tissue, left eye, initial encounter: Secondary | ICD-10-CM | POA: Insufficient documentation

## 2021-11-23 NOTE — Progress Notes (Signed)
Natalie Webb is a 46 yo female present for a 4 month follow-up with a history of Ruptured globe OS, total retinal detachment OS, and ptosis OS. Patient reports her left eye feels heavy and occasional headaches. Notes vision is stable. States doing well. Denies any eye pain, discomfort, headaches, or flashes/floaters today.     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 Erroll Luna)    #s/p PPV/MP/Retinectomy/EL/AfX/SO & Choroidal Drainage OS (Date 03/26/18)    # s/p PPV/SOR/MP/EL/SOI (Date 08/13/18 by Riazi/Puri/Clark) for TRD OS  OD: 20/20, VMA, good foveal contour, no RT/RD  Observe       OS: XT, HM, cornal cloudiness and band keratopathy in the center, apakia, aniridia, 360 retinectomy, retina flat with some translocation of macula towards inferior and atrophy same as before, ERM?, no heme, IOP 10 mmHg  No SO emulsification  Plan: Observe, considering SOR in the near future in case of being stable for a while and consider possible PKP at the same time    Cont Pred forte BID    Patient desires to continue as is and not doing the surgery due to limited outcome    #Headache  - splitting headache since trauma to left eye  - mildly helped with Tylenol  - no clear ocular explanation  She feels that it got worse  Already referred to Neurology  Plan: Consult for another time     #Glaucoma suspect based on C/D ratio  - unknown FHx  - IOP WNL in OD  - OCT nfl full OD  - HVF OD with high total deviation, nonspecific pattern deficits  - 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 Juel Burrow    # Ptosis of left eyelid  # s/p LELA 01/06/2020 by Dr Lorna Dibble:   h/o prior trauma and multiple prior surgeries  - healing well, improved MRD1    _________________________________________   Extended Ophthalmoscopy 11/23/21  by slit lamp and indirect methods reveals:    Base Eye Exam       Visual Acuity (Snellen - Linear)         Right Left    Dist sc 20/20 HM    Dist ph sc  NI              Tonometry (Tonopen, 8:17 AM)         Right Left    Pressure 17 10              Neuro/Psych       Oriented x3: Yes    Mood/Affect: Normal              Dilation       Both eyes: 1.0% Mydriacyl, 2.5% Neo Synephrine @ 8:17 AM                  Slit Lamp and Fundus Exam       External Exam  Right Left    External pigmented skin on R brow LXT              Slit Lamp Exam         Right Left    Lids/Lashes Normal for Age Ptosis LUL loss of crease    Conjunctiva/Sclera White and quiet, no pigmentary changes White and quiet, perilimbal pigment    Cornea Clear Band keratopathy without epi defect, deep folds, diffuse corneal opacification, flattened corneal contour    Anterior Chamber Deep and quiet flat    Iris Round, no rubeosis no visible iris tissue    Lens Clear aphakia but no view    Anterior Vitreous Clear SO not emulsified              Fundus Exam         Right Left    Disc good rim, no disc heme hazy view    C/D Ratio 0.7 0.3    Macula Normal contour and reflex for age, no RT/RD Flat retina, no heme, ERM?, atrophy, no CME, atrophic retina, macular ectopia inferiorly, fibrosisat the edge of the retinectomy, no heme same as before    Vessels Normal Normal    Periphery Flat and attached 360 degrees 360 retinectomy w laser, membrane superiorly with trace traction but no change in terms of detachment or breaks, no heme, fibrosis in the border same as before                  - See Assesment/plan above    Follow-up plan:  Riazi  OCTm  Fundus photo FA ICG FAF OCT  RNFL Bscan Other  Injection Laser   4 Months  ou                 I reviewed and confirmed the techs ROS, past histories, and readings.  I saw and examined the patient and reviewed in detail the findings of the technician/resident/fellow and edited it as needed to conform with my findings and impression and plan. The final examination findings, image interpretations, and plan as documented in the record represent my personal judgment and conclusions.    I have reviewed the medications    Based on todays exam, diagnostic studies, and review of records, the determination was made for or against any treatments listed above in the plan.    Dorann Lodge MD.

## 2021-12-04 ENCOUNTER — Encounter: Payer: Self-pay | Admitting: Retina Specialist

## 2021-12-04 ENCOUNTER — Ambulatory Visit: Payer: Worker's Comp, Other unspecified | Attending: Retina Specialist | Admitting: Retina Specialist

## 2021-12-04 DIAGNOSIS — H5712 Ocular pain, left eye: Secondary | ICD-10-CM | POA: Insufficient documentation

## 2021-12-04 DIAGNOSIS — S0532XD Ocular laceration without prolapse or loss of intraocular tissue, left eye, subsequent encounter: Secondary | ICD-10-CM | POA: Insufficient documentation

## 2021-12-04 DIAGNOSIS — S0532XA Ocular laceration without prolapse or loss of intraocular tissue, left eye, initial encounter: Secondary | ICD-10-CM | POA: Insufficient documentation

## 2021-12-04 DIAGNOSIS — H18422 Band keratopathy, left eye: Secondary | ICD-10-CM | POA: Insufficient documentation

## 2021-12-04 NOTE — Progress Notes (Signed)
TRIAGE  Patient rubbed her eye on Saturday night and woke up with eye pain, redness, swelling and irritation.   Would like to check to make sure that everything is alright.   No current eye pain.     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 Erroll Luna)    #s/p PPV/MP/Retinectomy/EL/AfX/SO & Choroidal Drainage OS (Date 03/26/18)    # s/p PPV/SOR/MP/EL/SOI (Date 08/13/18 by Riazi/Puri/Clark) for TRD OS  OD: 20/20, VMA, good foveal contour, no RT/RD  Observe       OS: XT, HM, cornal cloudiness and band keratopathy in the center, apakia, aniridia, 360 retinectomy, retina flat with some translocation of macula towards inferior and atrophy same as before, ERM?, no heme, IOP 6 mmHg  No SO emulsification  Had feelings of swelling in the left eye, no changes in the exam    Plan: Observe, considering SOR in the near future in case of being stable for a while and consider possible PKP at the same time    Cont Pred forte BID    Patient desires to continue as is and not doing the surgery due to limited outcome    #Headache  - splitting headache since trauma to left eye  - mildly helped with Tylenol  - no clear ocular explanation  She feels that it got worse  Already referred to Neurology  Plan: Consult for another time     #Glaucoma suspect based on C/D ratio  - unknown FHx  - IOP WNL in OD  - OCT nfl full OD  - HVF OD with high total deviation, nonspecific pattern deficits  - 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 Juel Burrow    # Ptosis of left eyelid  # s/p LELA 01/06/2020 by Dr Lorna Dibble:   h/o prior trauma and multiple prior surgeries  - healing well, improved MRD1    Interpreter # [phone]    _______________________________________   Extended Ophthalmoscopy 12/04/21  by slit lamp and indirect methods reveals:    Base Eye Exam       Visual Acuity (Snellen - Linear)         Right Left    Dist sc 20/20 HM              Tonometry (Tonopen, 3:24 PM)         Right Left    Pressure 11 6              Pupils         Dark Light    Right 4 2    Left No view               Neuro/Psych       Oriented x3: Yes    Mood/Affect: Normal  Slit Lamp and Fundus Exam       External Exam         Right Left    External pigmented skin on R brow LXT              Slit Lamp Exam         Right Left    Lids/Lashes Normal for Age Ptosis LUL loss of crease    Conjunctiva/Sclera White and quiet, no pigmentary changes White and quiet, perilimbal pigment    Cornea Clear Band keratopathy without epi defect, deep folds, diffuse corneal opacification, flattened corneal contour    Anterior Chamber Deep and quiet flat    Iris Round, no rubeosis no visible iris tissue    Lens Clear aphakia but no view    Anterior Vitreous Clear SO not emulsified              Fundus Exam         Right Left    Disc good rim, no disc heme hazy view    C/D Ratio 0.7 0.3    Macula Normal contour and reflex for age, no RT/RD Flat retina, no heme, ERM?, atrophy, no CME, atrophic retina, macular ectopia inferiorly, fibrosisat the edge of the retinectomy, no heme same as before    Vessels Normal Normal    Periphery Flat and attached 360 degrees 360 retinectomy w laser, membrane superiorly with trace traction but no change in terms of detachment or breaks, no heme, fibrosis in the border same as before                  - See Assesment/plan above    Follow-up plan:  Riazi  OCTm Fundus photo FA  ICG FAF OCT  RNFL Bscan Other  Injection Laser   3-4 Months  ou                 I reviewed and confirmed the techs ROS, past histories, and readings.  I saw and examined the patient and reviewed in detail the findings of the technician/resident/fellow and edited it as needed to conform with my findings and impression and plan. The final examination findings, image interpretations, and plan as documented in the record represent my personal judgment and conclusions.    I have reviewed the medications    Based on todays exam, diagnostic studies, and review of records, the determination was made for or against any treatments listed above in the plan.    Dorann Lodge MD.  _

## 2022-03-14 NOTE — Progress Notes (Signed)
ICD-10-CM ICD-9-CM   1. 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 Gregary Cromer.   -Family history of glaucoma? none  -Prior eye trauma? Yes, OS, injury at work with spinning part from printer hitting left eye     -Non-glaucoma ocular/systemic history:   - none    -Prior eye surgery:   - rupture globe repair OS 2021     -Eyedrop intolerance: none known  - 543/583 pachymetry  -steroid? 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   - stable glaucoma testing  - OS has band k, mildly symptomatic, already scheduled to see Dr. Earlyne Iba     I reviewed and confirmed the techs ROS, past histories, and readings.  I saw and examined the patient and reviewed in detail the findings of the resident/fellow. The final examination findings, image interpretations, and plan as documented in the record represent my personal judgment and conclusions.

## 2022-03-21 ENCOUNTER — Ambulatory Visit: Payer: Worker's Comp, Other unspecified | Attending: Glaucoma Specialist | Admitting: Glaucoma Specialist

## 2022-03-21 DIAGNOSIS — S0532XD Ocular laceration without prolapse or loss of intraocular tissue, left eye, subsequent encounter: Secondary | ICD-10-CM | POA: Insufficient documentation

## 2022-03-21 DIAGNOSIS — H40001 Preglaucoma, unspecified, right eye: Secondary | ICD-10-CM

## 2022-03-28 NOTE — Progress Notes (Signed)
TRIAGE  Patient rubbed her eye on Saturday night and woke up with eye pain, redness, swelling and irritation.   Would like to check to make sure that everything is alright.   No current eye pain.     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 Vicie Mutters)    #s/p PPV/MP/Retinectomy/EL/AfX/SO & Choroidal Drainage OS (Date 03/26/18)    # s/p PPV/SOR/MP/EL/SOI (Date 08/13/18 by Riazi/Puri/Clark) for TRD OS  OD: 20/20, VMA, good foveal contour, no RT/RD  Observe       OS: XT, HM, cornal cloudiness and band keratopathy in the center, apakia, aniridia, 360 retinectomy, retina flat with some translocation of macula towards inferior and atrophy same as before, ERM?, no heme, IOP 6 mmHg  No SO emulsification  Had feelings of swelling in the left eye, no changes in the exam    Plan: Observe, considering SOR in the near future in case of being stable for a while and consider possible PKP at the same time    Cont Pred forte BID    Patient desires to continue as is and not doing the surgery due to limited outcome    #Headache  - splitting headache since trauma to left eye  - mildly helped with Tylenol  - no clear ocular explanation  She feels that it got worse  Already referred to Neurology  Plan: Consult for another time     #Glaucoma suspect based on C/D ratio  - unknown FHx  - IOP WNL in OD  - OCT nfl full OD  - HVF OD with high total deviation, nonspecific pattern deficits  - 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 Augustin Coupe    # Ptosis of left eyelid  # s/p LELA 01/06/2020 by Dr Trena Platt:   h/o prior trauma and multiple prior surgeries  - healing well, improved MRD1    Interpreter # [phone]    _______________________________________   Extended Ophthalmoscopy 12/04/21  by slit lamp and indirect methods reveals:    Not recorded       - See Assesment/plan above    Follow-up plan:  Riazi  OCTm Fundus photo FA ICG FAF OCT  RNFL Bscan Other  Injection Laser   3-4 Months  ou                 I reviewed and confirmed the techs ROS, past histories, and readings.  I saw and examined the patient and reviewed in detail the findings of the technician/resident/fellow and edited it as needed to conform with my findings and impression and plan. The final examination findings, image interpretations, and plan as documented in the record represent my personal judgment and conclusions.    I have reviewed the medications    Based on todays exam, diagnostic studies, and review of records, the determination was  made for or against any treatments listed above in the plan.    Leola Brazil MD.  _

## 2022-03-29 ENCOUNTER — Ambulatory Visit: Payer: Worker's Comp, Other unspecified | Attending: Retina Specialist | Admitting: Retina Specialist

## 2022-03-29 ENCOUNTER — Encounter: Payer: Self-pay | Admitting: Retina Specialist

## 2022-03-29 DIAGNOSIS — H18422 Band keratopathy, left eye: Secondary | ICD-10-CM | POA: Insufficient documentation

## 2022-03-29 DIAGNOSIS — S0532XD Ocular laceration without prolapse or loss of intraocular tissue, left eye, subsequent encounter: Secondary | ICD-10-CM | POA: Insufficient documentation

## 2022-03-29 DIAGNOSIS — H33052 Total retinal detachment, left eye: Secondary | ICD-10-CM | POA: Insufficient documentation

## 2022-03-29 DIAGNOSIS — H5462 Unqualified visual loss, left eye, normal vision right eye: Secondary | ICD-10-CM | POA: Insufficient documentation

## 2022-03-29 DIAGNOSIS — S0532XA Ocular laceration without prolapse or loss of intraocular tissue, left eye, initial encounter: Secondary | ICD-10-CM | POA: Insufficient documentation

## 2022-04-11 ENCOUNTER — Encounter: Payer: Self-pay | Admitting: Ophthalmology

## 2022-04-11 ENCOUNTER — Ambulatory Visit: Payer: Worker's Comp, Other unspecified | Attending: Ophthalmology | Admitting: Ophthalmology

## 2022-04-11 DIAGNOSIS — H18422 Band keratopathy, left eye: Secondary | ICD-10-CM | POA: Insufficient documentation

## 2022-04-11 DIAGNOSIS — H179 Unspecified corneal scar and opacity: Secondary | ICD-10-CM | POA: Insufficient documentation

## 2022-04-11 DIAGNOSIS — H5712 Ocular pain, left eye: Secondary | ICD-10-CM | POA: Insufficient documentation

## 2022-04-11 DIAGNOSIS — H33052 Total retinal detachment, left eye: Secondary | ICD-10-CM

## 2022-04-11 MED ORDER — ATROPINE SULFATE 1 % OP SOLN
1.0000 [drp] | Freq: Two times a day (BID) | OPHTHALMIC | 3 refills | Status: AC
Start: 2022-04-11 — End: ?

## 2022-04-11 NOTE — Progress Notes (Signed)
Tech HPI:    47 YO f Presents for follow up with history of Stromal Haze/Scar OS   Patient states she has pain/ discomfort over left eye, reports frequent headache Vision has been stable     Compliant with the following drops: Prednisolone BID OS AT/ gel as needed       PMH:  Open globe injury 2020  H/o TRD         Assessment and Plan:      Corneal opacity and band K OS    Prior surgery for open globe injury and sequelae including TRD and funnel RD in 2020    Reviewed prior operative reports that describe severe course of disease since accident with open globe injury in 2020, including 2 subsequent surgeries for retinal detachment  Currently there is SO still in the eye. Per last retina note, there is possibility of additional retina surgery, SO removal and concurrent combined PKP OS. Patient is not interested in further surgery, especially since she has poor visual potential and has never seen better than HM (she says "shadows" but now "less shadows") since the initial injury.    As for discomfort OS, may be due to band K. There is no epi defect over band K.   Patient is still complaining of pain event with optimizing surface lubrication (AT + gel)  Discussed with patient r/b/a SuperK + EDTA vs. Pain management with prednisone + atropine vs. Scleral shell  Patient does not want any procedure or surgery in this moment.  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 Vicie Mutters)     #s/p PPV/MP/Retinectomy/EL/AfX/SO & Choroidal Drainage OS (Date 03/26/18)     # s/p PPV/SOR/MP/EL/SOI (Date 08/13/18 by Riazi/Puri/Clark) for TRD OS  OD: 20/20, VMA, good foveal contour, no RT/RD  Observe      OS: XT, HM, cornal cloudiness and  band keratopathy in the center, apakia, aniridia, 360 retinectomy, retina flat with some translocation of macula towards inferior and atrophy same as before, ERM?, no heme, IOP 14 mmHg  No SO emulsification  Had feelings of swelling in the left eye, no changes in the exam     Plan: Observe, considering SOR in the near future in case of being stable for a while and consider possible PKP at the same time     Cont Pred forte BID     Patient desires to continue as is and not doing the surgery due to limited outcome     #Headache  - splitting headache since trauma to left eye  - mildly helped with Tylenol  - no clear ocular explanation  She feels that it got worse  Already referred to Neurology  Headache is now getting better  Plan: Observe     #Glaucoma suspect based on C/D ratio  - unknown FHx  - IOP WNL in OD  - OCT nfl full OD  - HVF OD with high total deviation, nonspecific pattern deficits  - 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 Earlyne Iba. She agrees with my findings and plans.    Alden Hipp, MD  [place] Fellow      Interpreter code: [phone]      Patient does not want any surgical intervention for prephthisical OS. Focus on comfort care OS.     I performed the above HPI. I reviewed and confirmed the technicians' ROS, past histories, and readings.  I saw and examined the patient, and reviewed all relevant and available imaging, labs, and historical notes. The final examination findings, image interpretations, and plan as documented in the record represent my personal judgment and  conclusions.     Manuella Ghazi, MD  Professor of Ophthalmology  Cornea, Cataract & Uveitis

## 2022-05-01 ENCOUNTER — Encounter: Payer: Self-pay | Admitting: Ophthalmology

## 2022-05-01 ENCOUNTER — Ambulatory Visit: Payer: Worker's Comp, Other unspecified | Attending: Ophthalmology | Admitting: Ophthalmology

## 2022-05-01 DIAGNOSIS — H179 Unspecified corneal scar and opacity: Secondary | ICD-10-CM | POA: Insufficient documentation

## 2022-05-01 DIAGNOSIS — H547 Unspecified visual loss: Secondary | ICD-10-CM

## 2022-05-01 DIAGNOSIS — H5010 Unspecified exotropia: Secondary | ICD-10-CM | POA: Insufficient documentation

## 2022-05-01 DIAGNOSIS — H02402 Unspecified ptosis of left eyelid: Secondary | ICD-10-CM | POA: Insufficient documentation

## 2022-05-01 DIAGNOSIS — S0532XS Ocular laceration without prolapse or loss of intraocular tissue, left eye, sequela: Secondary | ICD-10-CM | POA: Insufficient documentation

## 2022-05-01 DIAGNOSIS — H02006 Unspecified entropion of left eye, unspecified eyelid: Secondary | ICD-10-CM

## 2022-05-01 NOTE — Addendum Note (Signed)
Addended by: Nadean Corwin on: 05/01/2022 01:45 PM     Modules accepted: Orders

## 2022-05-01 NOTE — Progress Notes (Addendum)
See "Detailed Report" under my note in the Encounter tab for more details regarding today's visit and eye exam.    I reviewed and confirmed the techs HPI, review of systems, past histories, and readings.    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 She complains that her eye deviates outwards but this is now stable. C/o LUL lashes.     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

## 2022-06-13 ENCOUNTER — Encounter: Payer: Self-pay | Admitting: Ophthalmology

## 2022-06-13 ENCOUNTER — Ambulatory Visit: Payer: Worker's Comp, Other unspecified | Attending: Ophthalmology | Admitting: Ophthalmology

## 2022-06-13 DIAGNOSIS — Q158 Other specified congenital malformations of eye: Secondary | ICD-10-CM | POA: Insufficient documentation

## 2022-06-13 DIAGNOSIS — Q133 Congenital corneal opacity: Secondary | ICD-10-CM | POA: Insufficient documentation

## 2022-06-13 DIAGNOSIS — H02402 Unspecified ptosis of left eyelid: Secondary | ICD-10-CM | POA: Insufficient documentation

## 2022-06-13 DIAGNOSIS — H18422 Band keratopathy, left eye: Secondary | ICD-10-CM | POA: Insufficient documentation

## 2022-06-13 DIAGNOSIS — Z09 Encounter for follow-up examination after completed treatment for conditions other than malignant neoplasm: Secondary | ICD-10-CM | POA: Insufficient documentation

## 2022-06-13 DIAGNOSIS — H44522 Atrophy of globe, left eye: Secondary | ICD-10-CM | POA: Insufficient documentation

## 2022-06-13 NOTE — Progress Notes (Signed)
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 Per last retina note, there is possibility of additional retina surgery, SO removal and concurrent combined PKP OS. Patient is not interested in further surgery, especially since she has poor visual potential and has never seen better than HM (she says "shadows" but now "less shadows") since the initial injury.    As for discomfort OS, may be due to band K. There is no epi defect over band K. 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 Roland Earl. She agrees with my findings and plans.    Clearence Ped, MD  Cornea Research Fellow    I performed the above HPI. I reviewed and confirmed the technicians' ROS, past histories, and readings.  I saw and examined the patient, and reviewed all relevant and available imaging, labs, and historical notes. The final examination findings, image interpretations, and plan as documented in the record represent my personal judgment and conclusions.     Gershon Crane, MD  Professor of Ophthalmology  Cornea, Cataract & Uveitis

## 2022-07-21 ENCOUNTER — Other Ambulatory Visit: Payer: Self-pay | Admitting: Retina Specialist

## 2022-07-21 DIAGNOSIS — H33052 Total retinal detachment, left eye: Secondary | ICD-10-CM

## 2022-07-23 ENCOUNTER — Other Ambulatory Visit: Payer: Self-pay | Admitting: Retina Specialist

## 2022-07-23 DIAGNOSIS — H33052 Total retinal detachment, left eye: Secondary | ICD-10-CM

## 2022-07-23 MED ORDER — PREDNISOLONE ACETATE 1 % OP SUSP
OPHTHALMIC | 0 refills | Status: DC
Start: 2022-07-23 — End: 2022-07-30

## 2022-07-23 NOTE — Telephone Encounter (Signed)
Refill request forwarded to MD

## 2022-07-23 NOTE — Telephone Encounter (Signed)
SPANISH SPEAKER    Patient requesting a PA request to insurance for a  prescription refill for Medication and Dosage: prednisoLONE acetate (PRED FORTE) 1 % ophthalmic suspension to be filled at:   Pharmacy:   [facility] [phone] - [address], [place] - [phone] [facility] [address]    Do you have less than 5 days of meds remaining? Yes, pt is out    Does the patient/caller request to be called once refill is sent to the pharmacy? Yes      Patient's current medications:  Current Outpatient Medications   Medication Instructions    acetaminophen (TYLENOL) 650 mg, Oral, EVERY 4 HOURS PRN    amLODIPINE (NORVASC) 5 mg, Oral, DAILY    anterior blue eye kit USE AS INSTRUCTED BY YOUR PHYSICIAN (KIT FOR RIGHT EYE)    atropine (ISOPTO) 1 % ophthalmic solution INSTILL 1 DROP IN LEFT EYE AT BEDTIME    atropine (ISOPTO) 1 % solution 1 drop, Left Eye, 2 TIMES DAILY    Carboxymethylcell-Hypromellose 0.25-0.3 % GEL 1 Application., Left Eye, AT BEDTIME    carboxymethylcellulose, PF, 0.5 % SOLN 1 drop, Both Eyes, 4 TIMES DAILY    ferrous sulfate 325 mg, Oral, 3 TIMES DAILY    homatropine 5 % ophthalmic solution 1 drop, Left Eye, 2 times daily    prednisoLONE acetate (PRED FORTE) 1 % ophthalmic suspension 1 drop, Left Eye, 3 TIMES DAILY

## 2022-07-24 MED ORDER — PREDNISOLONE ACETATE 1 % OP SUSP
1.0000 [drp] | Freq: Two times a day (BID) | OPHTHALMIC | 11 refills | Status: DC
Start: 2022-07-24 — End: 2022-07-30

## 2022-07-26 ENCOUNTER — Ambulatory Visit: Payer: Worker's Comp, Other unspecified | Admitting: Retina Specialist

## 2022-07-30 ENCOUNTER — Encounter: Payer: Self-pay | Admitting: Retina Specialist

## 2022-07-30 ENCOUNTER — Ambulatory Visit: Payer: Worker's Comp, Other unspecified | Attending: Retina Specialist | Admitting: Retina Specialist

## 2022-07-30 DIAGNOSIS — S0532XA Ocular laceration without prolapse or loss of intraocular tissue, left eye, initial encounter: Secondary | ICD-10-CM | POA: Insufficient documentation

## 2022-07-30 DIAGNOSIS — H5462 Unqualified visual loss, left eye, normal vision right eye: Secondary | ICD-10-CM | POA: Insufficient documentation

## 2022-07-30 DIAGNOSIS — H33052 Total retinal detachment, left eye: Secondary | ICD-10-CM | POA: Insufficient documentation

## 2022-07-30 MED ORDER — PREDNISOLONE ACETATE 1 % OP SUSP
1.0000 [drp] | Freq: Two times a day (BID) | OPHTHALMIC | 11 refills | Status: DC
Start: 2022-07-30 — End: 2023-02-28

## 2022-07-30 MED ORDER — PREDNISOLONE ACETATE 1 % OP SUSP
1.0000 [drp] | Freq: Two times a day (BID) | OPHTHALMIC | 3 refills | Status: DC
Start: 2022-07-30 — End: 2023-02-28

## 2022-07-30 NOTE — Progress Notes (Signed)
Chief Complaint   Patient presents with    Decreased Vision     Past Medical History:   Diagnosis Date    Retinal detachment      _______________________________________      ICD-10-CM ICD-9-CM   1. Ruptured globe of left eye, initial encounter  S05.32XA 871.0   2. Total retinal detachment of left eye  H33.052 361.05   3. 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 Erroll Luna)    #s/p PPV/MP/Retinectomy/EL/AfX/SO & Choroidal Drainage OS (Date 03/26/18)    # s/p PPV/SOR/MP/EL/SOI (Date 08/13/18 by Riazi/Puri/Clark) for TRD OS  OD: 20/20, VMA, good foveal contour, no RT/RD  Observe       OS: XT, HM, cornal cloudiness and band keratopathy in the center, apakia, aniridia, 360 retinectomy, retina flat with some translocation of macula towards inferior and atrophy same as before, ERM?, no heme, IOP 10 mmHg  No SO emulsification  Had feelings of swelling in the left eye, no changes in the exam    Plan: Observe, considering SOR in the near future in case of being stable for a while and consider possible PKP at the same time    Cont Pred forte BID    Patient desires to continue as is and not doing the surgery due to limited outcome    I mentioned to her the importance of being observed due to having SO in the eye and possibility of even complete visual loss in case of SO emulsification  She is hesitant to come back every 3-4 months, but I advised her to be compliant with  that.    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 Juel Burrow    # Ptosis of left eyelid  # s/p LELA 01/06/2020 by Dr Lorna Dibble:   h/o prior trauma and multiple prior surgeries  - healing well, improved MRD1    ________________________________________   Extended Ophthalmoscopy 07/30/22  by slit lamp and indirect methods reveals:    Base Eye Exam       Visual Acuity (Snellen - Linear)         Right Left    Dist sc 20/20 HM              Tonometry (Tonopen, 2:57 PM)  Right Left    Pressure 16 10              Pupils         Dark Light    Right 3 2    Left no view               Neuro/Psych       Oriented x3: Yes    Mood/Affect: Normal              Dilation       Both eyes: 1.0% Mydriacyl, 2.5% Neo Synephrine @ 2:58 PM                  Slit Lamp and Fundus Exam       External Exam         Right Left    External pigmented skin on R brow LXT              Slit Lamp Exam         Right Left    Lids/Lashes Normal for Age improved MRD1, LUL entropion with lash ptosis    Conjunctiva/Sclera White and quiet, no pigmentary changes White and quiet    Cornea Clear, no staining Stromal Haze no view; large band K- limbus to limbus    Anterior Chamber Deep and quiet no view    Iris Round, no rubeosis no view    Lens Clear no view    Anterior Vitreous Clear SO not emulsified, no hemorrhage              Fundus Exam         Right Left    Disc good rim, no disc heme hazy view    C/D Ratio 0.7     Macula Normal contour and reflex for age, no RT/RD Flat retina, no  heme, ERM?, atrophy    Vessels Normal Normal    Periphery Flat and attached 360 degrees 360 retinectomy w laser, membrane superiorly with trace traction but no change in terms of detachment or breaks, no heme, fibrosis in the border same as before, no tractions                  - See Assesment/plan above    Follow-up plan:  Riazi  OCTm Fundus photo FA ICG FAF OCT  RNFL Bscan Other  Injection Laser   4 Months  ou ou                I reviewed and confirmed the techs ROS, past histories, and readings.  I saw and examined the patient and reviewed in detail the findings of the technician/resident/fellow and edited it as needed to conform with my findings and impression and plan. The final examination findings, image interpretations, and plan as documented in the record represent my personal judgment and conclusions.    I have reviewed the medications    Based on todays exam, diagnostic studies, and review of records, the determination was made for or against any treatments listed above in the plan.    Dorann Lodge MD.

## 2022-10-23 ENCOUNTER — Telehealth: Payer: Self-pay | Admitting: Retina Specialist

## 2022-10-23 NOTE — Telephone Encounter (Signed)
Patient states the pharmacy informed her a [place] RSA form needs to be submitted to workers comp in order for her to get a refill for prednisoLONE acetate (PRED FORTE) 1 % ophthalmic suspension. She states its urgent she is almost out of medication.

## 2022-10-24 ENCOUNTER — Telehealth: Payer: Self-pay | Admitting: Retina Specialist

## 2022-10-24 NOTE — Telephone Encounter (Signed)
October 24, 2022  Milus Height     CL    10/24/22  2:40 PM  Note  PA request sent over via Fax to [facility] for prednisolone eye drops

## 2022-10-24 NOTE — Telephone Encounter (Signed)
PA request sent over via Fax to [facility] for prednisolone eye drops

## 2022-10-30 ENCOUNTER — Telehealth: Payer: Self-pay | Admitting: Retina Specialist

## 2022-10-30 NOTE — Telephone Encounter (Signed)
Spanish Call   Pt Natalie Webb, calling stating that has not been able to get refill for Pred Forte Solution, requesting if we could refax information to [facility] adjuster, form RSA.     Please assist, thank you.

## 2022-10-31 ENCOUNTER — Ambulatory Visit: Payer: Worker's Comp, Other unspecified | Admitting: Ophthalmology

## 2022-11-07 ENCOUNTER — Ambulatory Visit: Payer: Worker's Comp, Other unspecified | Attending: Ophthalmology | Admitting: Ophthalmology

## 2022-11-07 ENCOUNTER — Encounter: Payer: Self-pay | Admitting: Ophthalmology

## 2022-11-07 DIAGNOSIS — S0532XS Ocular laceration without prolapse or loss of intraocular tissue, left eye, sequela: Secondary | ICD-10-CM | POA: Insufficient documentation

## 2022-11-07 DIAGNOSIS — H18422 Band keratopathy, left eye: Secondary | ICD-10-CM | POA: Insufficient documentation

## 2022-11-07 DIAGNOSIS — H5010 Unspecified exotropia: Secondary | ICD-10-CM | POA: Insufficient documentation

## 2022-11-07 DIAGNOSIS — H547 Unspecified visual loss: Secondary | ICD-10-CM | POA: Insufficient documentation

## 2022-11-07 DIAGNOSIS — H02006 Unspecified entropion of left eye, unspecified eyelid: Secondary | ICD-10-CM | POA: Insufficient documentation

## 2022-11-07 DIAGNOSIS — H179 Unspecified corneal scar and opacity: Secondary | ICD-10-CM | POA: Insufficient documentation

## 2022-11-07 DIAGNOSIS — H02402 Unspecified ptosis of left eyelid: Secondary | ICD-10-CM | POA: Insufficient documentation

## 2022-11-07 NOTE — Progress Notes (Signed)
See "Detailed Report" under my note in the Encounter tab for more details regarding today's visit and eye exam.    I reviewed and confirmed the techs HPI, review of systems, past histories, and readings.    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 She complains that her eye deviates outwards but this is now stable. Denies complaints today.      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

## 2022-11-07 NOTE — Telephone Encounter (Signed)
PA request for pred forte was faxed over on 10/9. We have not received any notification that the eyedrops have been approved. I left a message for Administrator, arts to get a status. 11/07/2022

## 2022-11-08 ENCOUNTER — Ambulatory Visit: Payer: Worker's Comp, Other unspecified | Attending: Retina Specialist | Admitting: Retina Specialist

## 2022-11-08 ENCOUNTER — Encounter: Payer: Self-pay | Admitting: Ophthalmology

## 2022-11-08 DIAGNOSIS — S0532XA Ocular laceration without prolapse or loss of intraocular tissue, left eye, initial encounter: Secondary | ICD-10-CM | POA: Insufficient documentation

## 2022-11-08 DIAGNOSIS — H5462 Unqualified visual loss, left eye, normal vision right eye: Secondary | ICD-10-CM | POA: Insufficient documentation

## 2022-11-08 DIAGNOSIS — S0532XD Ocular laceration without prolapse or loss of intraocular tissue, left eye, subsequent encounter: Secondary | ICD-10-CM | POA: Insufficient documentation

## 2022-11-08 DIAGNOSIS — H33052 Total retinal detachment, left eye: Secondary | ICD-10-CM | POA: Insufficient documentation

## 2022-11-08 NOTE — Progress Notes (Signed)
Chief Complaint   Patient presents with    Blurred Vision     Past Medical History:   Diagnosis Date    Retinal detachment      _______________________________________      ICD-10-CM ICD-9-CM   1. Ruptured globe of left eye, initial encounter  S05.32XA 871.0   2. Total retinal detachment of left eye  H33.052 361.05   3. Decreased vision of left eye  H54.62 369.8   4. 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 Erroll Luna)    #s/p PPV/MP/Retinectomy/EL/AfX/SO & Choroidal Drainage OS (Date 03/26/18)    # s/p PPV/SOR/MP/EL/SOI (Date 08/13/18 by Riazi/Puri/Clark) for TRD OS  OD: 20/20, VMA, good foveal contour, no RT/RD  Observe       OS: XT, HM, cornal cloudiness and band keratopathy in the center, apakia, aniridia, 360 retinectomy, retina flat with some translocation of macula towards inferior and atrophy same as before, ERM?, no heme, IOP 10 mmHg  No SO emulsification  Had feelings of swelling in the left eye, no changes in the exam    Plan: Observe, considering SOR in the near future in case of being stable for a while and consider possible PKP at the same time    Cont Pred forte BID    Patient desires to continue as is and not doing the surgery due to limited outcome    I mentioned to her the importance of being observed due to having SO in the eye and possibility of even complete visual loss in case of SO  emulsification  She is hesitant to come back every 3-4 months, but I advised her to be compliant with that.    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 Juel Burrow    # Ptosis of left eyelid  # s/p LELA 01/06/2020 by Dr Lorna Dibble:   h/o prior trauma and multiple prior surgeries  - healing well, improved MRD1  # LUL entropion   - mild monitor for now  - counseling at length, PRN     ________________________________________   Extended Ophthalmoscopy 11/08/22  by slit lamp and indirect methods reveals:    [facility] Exam       Visual  Acuity (Snellen - Linear)         Right Left    Dist sc 20/20 -1 20/HM              Tonometry (Tonopen, 8:55 AM)         Right Left    Pressure 12 12              Neuro/Psych       Oriented x3: Yes    Mood/Affect: Normal              Dilation       Both eyes: 1.0% Mydriacyl, 1.0% Cyclogyl @ 8:56 AM                  Slit Lamp and Fundus Exam       External Exam         Right Left    External pigmented skin on R brow LXT              Slit Lamp Exam         Right Left    Lids/Lashes Normal for Age improved MRD1, loss of medial lid crease, mild entropion LUL    Conjunctiva/Sclera White and quiet, no pigmentary changes White and quiet    Cornea Clear, no staining Stromal Haze no view; large band K- limbus to limbus in klthe center    Anterior Chamber Deep and quiet no view    Iris Round, no rubeosis no view    Lens Clear no view    Anterior Vitreous Clear SO not emulsified, no hemorrhage              Fundus Exam         Right  Left    Disc good rim, no disc heme hazy view    C/D Ratio 0.7     Macula Normal contour and reflex for age, no RT/RD, VMA Flat retina, no heme, ERM?, atrophy    Vessels Normal Normal    Periphery Flat and attached 360 degrees 360 retinectomy w laser, membrane superiorly with trace traction but no change in terms of detachment or breaks, no heme, fibrosis in the border same as before, no tractions                  - See Assesment/plan above    Follow-up plan:  Riazi  OCTm Fundus photo FA ICG FAF OCT  RNFL Bscan Other  Injection Laser   4 Months  ou ou                I reviewed and confirmed the techs ROS, past histories, and readings.  I saw and examined the patient and reviewed in detail the findings of the technician/resident/fellow and edited it as needed to conform with my findings and impression and plan. The final examination findings, image interpretations, and plan as documented in the record represent my personal judgment and conclusions.    I have reviewed the medications    Based on todays exam, diagnostic studies, and review of records, the determination was made for or against any treatments listed above in the plan.    Dorann Lodge MD.

## 2022-11-09 ENCOUNTER — Telehealth: Payer: Self-pay | Admitting: Retina Specialist

## 2022-11-09 NOTE — Telephone Encounter (Signed)
[employer] case manager calling to advise eye drops have approved . Called clinic advised Marlen.       Thank you

## 2022-11-09 NOTE — Telephone Encounter (Signed)
Received call from [facility] case manager who confirmed the eye drops have been approved.

## 2023-01-17 NOTE — Telephone Encounter (Signed)
PA for Prednisone approved 10/31/2022-05/31/2023

## 2023-02-27 NOTE — Progress Notes (Unsigned)
No chief complaint on file.    Past Medical History:   Diagnosis Date    Retinal detachment      _______________________________________    No diagnosis found.      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 Erroll Luna)    #s/p PPV/MP/Retinectomy/EL/AfX/SO & Choroidal Drainage OS (Date 03/26/18)    # s/p PPV/SOR/MP/EL/SOI (Date 08/13/18 by Riazi/Puri/Clark) for TRD OS  OD: 20/20, VMA, good foveal contour, no RT/RD  Observe       OS: XT, HM, cornal cloudiness and band keratopathy in the center, apakia, aniridia, 360 retinectomy, retina flat with some translocation of macula towards inferior and atrophy same as before, ERM?, no heme, IOP 10 mmHg  No SO emulsification  Had feelings of swelling in the left eye, no changes in the exam    Plan: Observe, considering SOR in the near future in case of being stable for a while and consider possible PKP at the same time    Cont Pred forte BID    Patient desires to continue as is and not doing the surgery due to limited outcome    I mentioned to her the importance of being observed due to having SO in the eye and possibility of even complete visual loss in case of SO emulsification  She is hesitant to come back every 3-4 months, but I advised her to be compliant with that.    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 Juel Burrow    # Ptosis of left eyelid  # s/p LELA 01/06/2020 by Dr Lorna Dibble:   h/o prior trauma and multiple prior surgeries  - healing well, improved MRD1  # LUL entropion   - mild monitor for now  - counseling at length, PRN     ________________________________________   Extended Ophthalmoscopy 11/08/22  by slit lamp and indirect methods reveals:    Not recorded       - See Assesment/plan above    Follow-up plan:  Riazi  OCTm Fundus photo FA ICG FAF OCT  RNFL Bscan Other  Injection Laser   4 Months  ou ou                I reviewed and confirmed the techs ROS, past histories, and readings.  I saw and examined the patient  and reviewed in detail the findings of the technician/resident/fellow and edited it as needed to conform with my findings and impression and plan. The final examination findings, image interpretations, and plan as documented in the record represent my personal judgment and conclusions.    I have reviewed the medications    Based on todays exam, diagnostic studies, and review of records, the determination was made for or against any treatments listed above in the plan.    Dorann Lodge MD.

## 2023-02-28 ENCOUNTER — Encounter: Payer: Self-pay | Admitting: Retina Specialist

## 2023-02-28 ENCOUNTER — Ambulatory Visit: Payer: Worker's Comp, Other unspecified | Attending: Retina Specialist | Admitting: Retina Specialist

## 2023-02-28 DIAGNOSIS — S0532XD Ocular laceration without prolapse or loss of intraocular tissue, left eye, subsequent encounter: Secondary | ICD-10-CM | POA: Insufficient documentation

## 2023-02-28 DIAGNOSIS — S0532XA Ocular laceration without prolapse or loss of intraocular tissue, left eye, initial encounter: Secondary | ICD-10-CM | POA: Insufficient documentation

## 2023-02-28 DIAGNOSIS — H33052 Total retinal detachment, left eye: Secondary | ICD-10-CM | POA: Insufficient documentation

## 2023-02-28 DIAGNOSIS — H40001 Preglaucoma, unspecified, right eye: Secondary | ICD-10-CM | POA: Insufficient documentation

## 2023-02-28 DIAGNOSIS — H5462 Unqualified visual loss, left eye, normal vision right eye: Secondary | ICD-10-CM | POA: Insufficient documentation

## 2023-02-28 MED ORDER — PREDNISOLONE ACETATE 1 % OP SUSP
1.0000 [drp] | Freq: Two times a day (BID) | OPHTHALMIC | 11 refills | Status: DC
Start: 2023-02-28 — End: 2023-09-12

## 2023-03-14 ENCOUNTER — Ambulatory Visit: Payer: Worker's Comp, Other unspecified | Admitting: Retina Specialist

## 2023-03-20 ENCOUNTER — Ambulatory Visit: Payer: Worker's Comp, Other unspecified | Attending: Glaucoma Specialist | Admitting: Glaucoma Specialist

## 2023-03-20 ENCOUNTER — Encounter: Payer: Self-pay | Admitting: Glaucoma Specialist

## 2023-03-20 DIAGNOSIS — H40001 Preglaucoma, unspecified, right eye: Secondary | ICD-10-CM | POA: Insufficient documentation

## 2023-03-20 DIAGNOSIS — S0532XD Ocular laceration without prolapse or loss of intraocular tissue, left eye, subsequent encounter: Secondary | ICD-10-CM | POA: Insufficient documentation

## 2023-03-20 NOTE — Progress Notes (Signed)
 ICD-10-CM ICD-9-CM   1. 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 Ruptured globe of left eye, subsequent encounter  S05.32XD V58.89     871.0     Patient referred by Dr. Lavell Islam.   -Family history of glaucoma? none  -Prior eye trauma? Yes, OS, injury at work with spinning part from printer hitting left eye     -Non-glaucoma ocular/systemic history:   - none    -Prior eye surgery:   - rupture globe repair OS 2021     -Eyedrop intolerance: none known  - 543/583 pachymetry  -steroid? 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   - stable glaucoma testing  - OS has band k f/b Dr. Nedra Hai  Repeat testing in next visit and if stable will follow annually for right eye    I reviewed and confirmed the techs ROS, past histories, and readings.  I saw and examined the patient and reviewed in detail the findings of the resident/fellow. The final examination findings, image interpretations, and plan as documented in the record represent my personal judgment and conclusions.

## 2023-05-15 IMAGING — CR CHEST 2 VWS PA LAT
1 series · 2 of 2 positions shown · non-contrast
Comparison: None

HISTORY: Right rib fracture. Recent pneumothorax.
TECHNIQUE: PA and lateral chest radiographs

[Series 1: pa · 0.17mm/px · 2 of 2 slices shown]
[im 1/2]
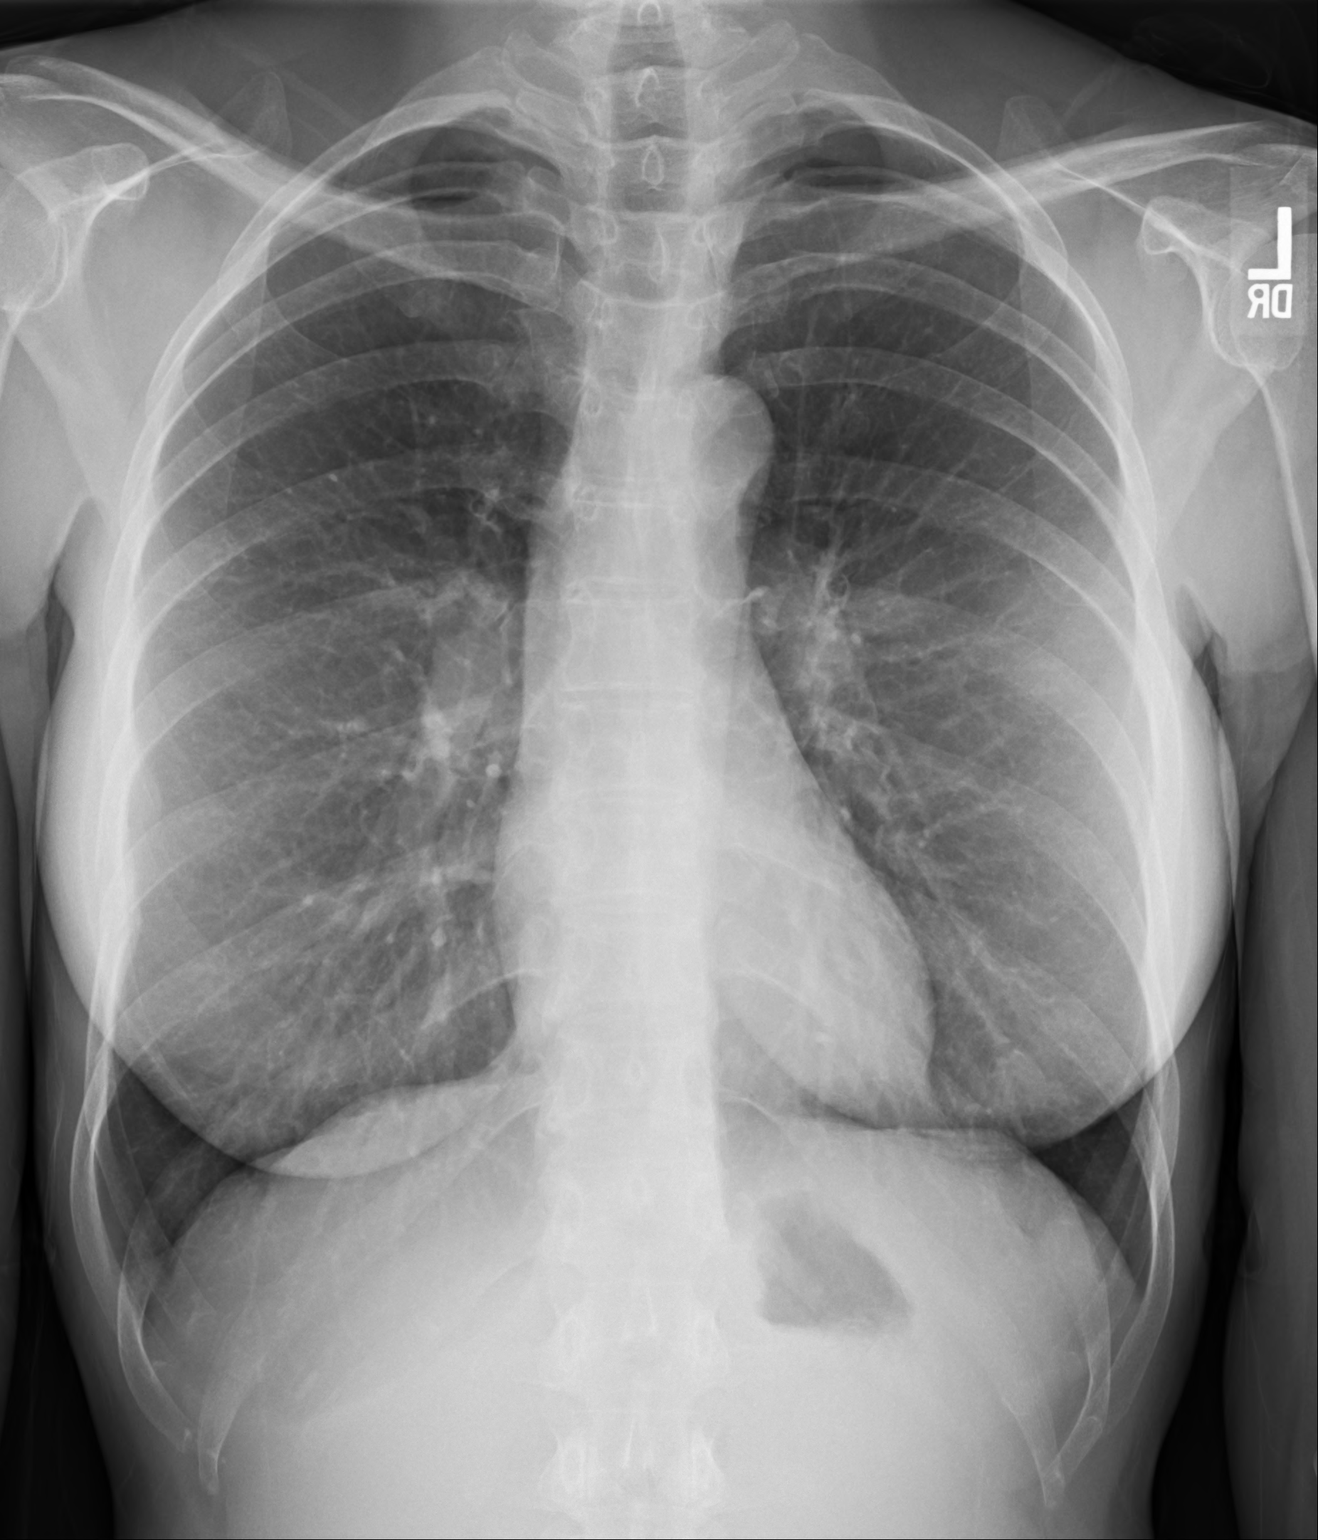
[im 2/2]
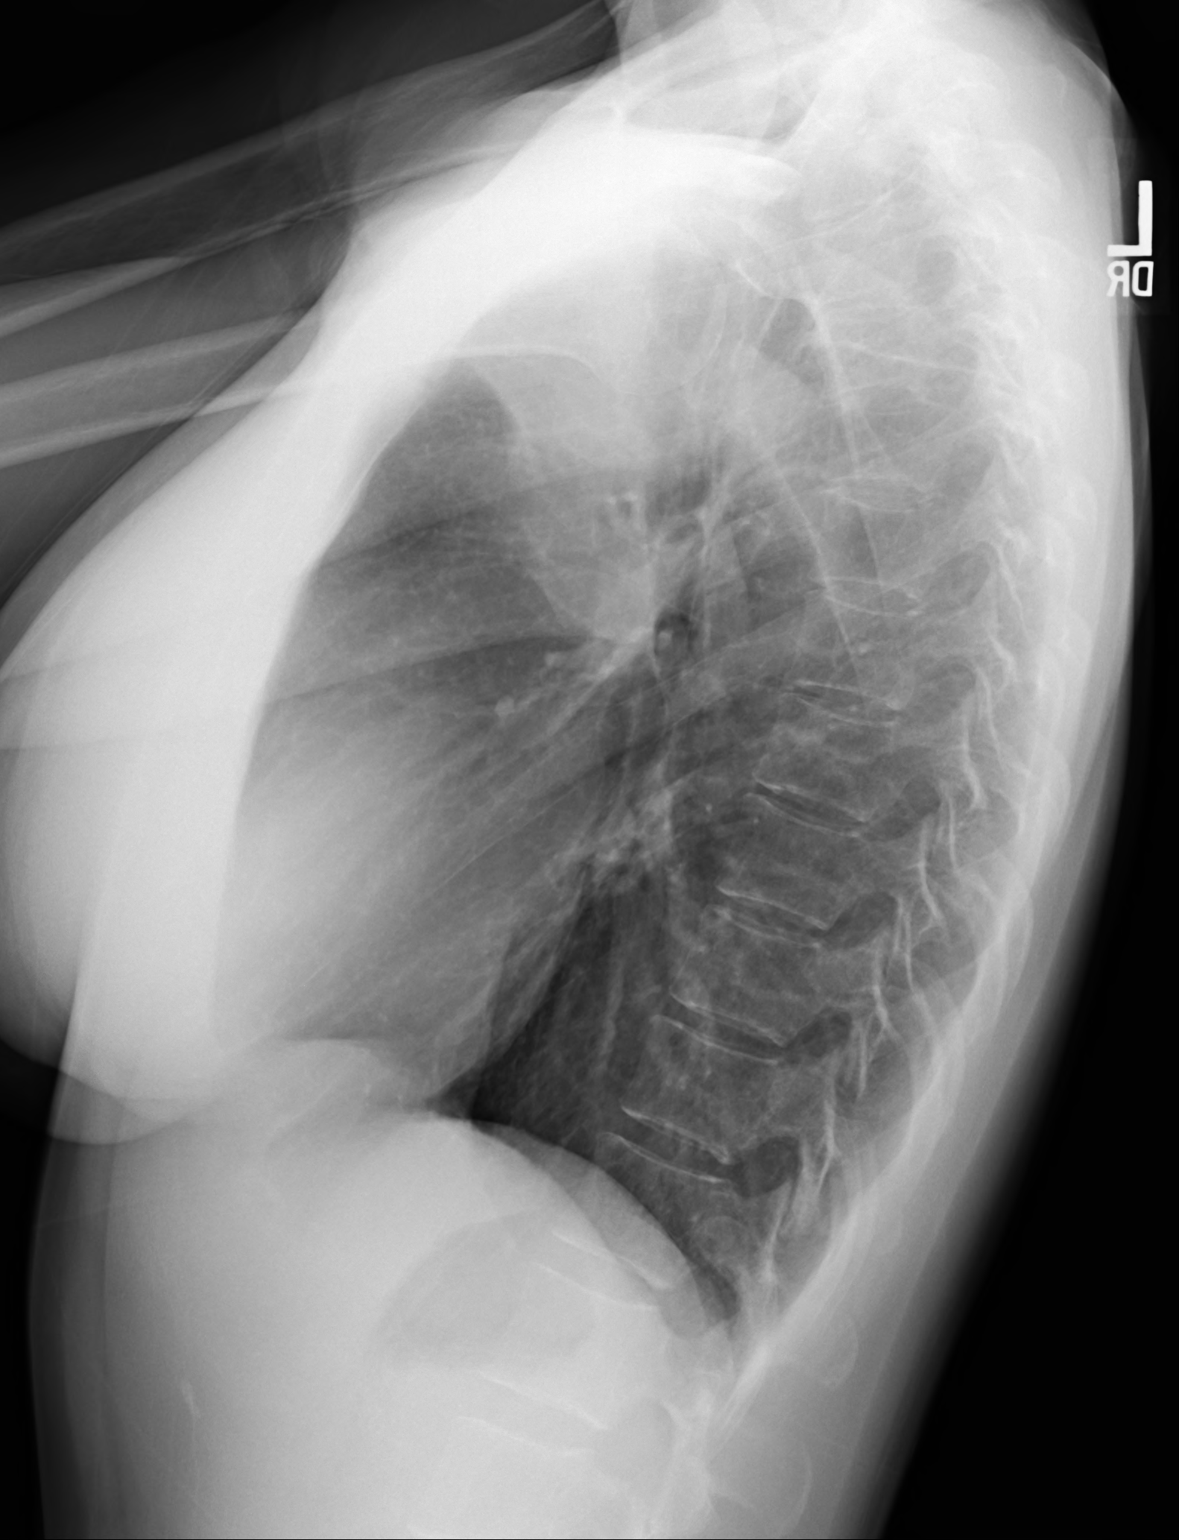

[2 of 2 positions shown; findings below may reference images not displayed]

FINDINGS: Heart size is normal. No pleural effusion or pneumothorax. No consolidation.
IMPRESSION: No acute chest findings.

## 2023-05-27 ENCOUNTER — Telehealth: Payer: Self-pay | Admitting: Retina Specialist

## 2023-05-27 NOTE — Telephone Encounter (Signed)
 [employer]  Patient is requesting price quote.    Patient Insurance: Per pt the workers comp case has been closed and she would like to keep appt of 07/08/2023       Are they opting out? no

## 2023-05-28 NOTE — Telephone Encounter (Signed)
 Spoke to patient and let her know the estimate amount. Patient decided to keep the appointment.    No further action required

## 2023-06-03 ENCOUNTER — Telehealth: Payer: Self-pay | Admitting: Glaucoma Specialist

## 2023-06-03 NOTE — Telephone Encounter (Signed)
 Patient  is requesting price quote.    Patient Insurance: Not insurance        Are they opting out? YES

## 2023-06-07 NOTE — Telephone Encounter (Signed)
 Please see previous TE. Estimate has been created and attached.    No further action required.

## 2023-06-27 ENCOUNTER — Ambulatory Visit: Payer: Self-pay | Admitting: Retina Specialist

## 2023-07-08 ENCOUNTER — Ambulatory Visit: Payer: Self-pay | Admitting: Retina Specialist

## 2023-07-10 NOTE — Progress Notes (Deleted)
 Here to check for any new retinal breaks or retinal detachment with a history of severe trauma and vitrectomy in the left eye    Past Medical History:   Diagnosis Date    Retinal detachment      _______________________________________    No diagnosis found.        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, apakia, aniridia, 360 retinectomy, retina flat with some translocation of macula towards inferior and atrophy same as before, ERM?, no heme, IOP 10 mmHg  No SO emulsification  Had feelings of swelling in the left eye, no changes in the exam    Plan: Observe, considering SOR in the future in case of being stable for a while and consider possible PKP at the same time    Cont Pred forte  BID, IP is stable today     Patient desires to continue as is and not doing the surgery due to limited outcome    I mentioned to her the importance of being observed due to having SO in the eye and possibility of even complete visual loss in case of SO emulsification  She is hesitant to come back every 3-4 months, but I advised her to be compliant with that.    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 Melia    # Ptosis of left eyelid  # s/p LELA 01/06/2020 by Dr Deri:   h/o prior trauma and multiple prior surgeries  - healing well, improved MRD1  # LUL entropion   - mild monitor for now  - counseling at length, PRN     _________________________________________   Extended Ophthalmoscopy 02/28/23  by slit lamp and indirect methods reveals:    Not recorded       - See Assesment/plan above    Follow-up plan:  Riazi  OCTm Fundus photo FA ICG FAF OCT  RNFL Bscan Other  Injection Laser   4 Months  ou ou  I reviewed and confirmed the techs ROS, past histories, and readings.  I saw and examined the patient and reviewed in detail the findings of the technician/resident/fellow and edited it as needed to conform with my findings and impression and plan. The final examination findings, image interpretations, and plan as documented in the record represent my personal judgment and conclusions.    I have reviewed the medications    Based on todays exam, diagnostic studies, and review of records, the determination was made for or against any treatments listed above in the plan.    Emery Maltese MD.

## 2023-07-11 ENCOUNTER — Ambulatory Visit: Payer: Self-pay | Admitting: Retina Specialist

## 2023-07-12 ENCOUNTER — Telehealth: Payer: Self-pay

## 2023-07-12 NOTE — Telephone Encounter (Signed)
 Gwendolyn Hegnwald calling, states is an interpreter for the patient sent by her Workers Comp insurance/[employer].  States last time she spoke with patient, she told her Work Comp was going to close her case.  Gwendolyn still has dates 10/22 and 11/05 scheduled to meet with patient for appointments.  Wanted to know if pt still scheduled on these dates.  Confirmed that patient was still scheduled.  Gwendolyn wanted to know if pt still going through Work [employer] or paying out of pocket?  Informed there was confusion because apt notes for new upcoming appointment with Dr.Riazi on 08/28 states pt will be self pay, but has authorization pending? If someone can please contact Gwendolyn to clarify.  States would ask patient, but every since her injury she is confused about her appointments.  Please call her back [phone] (okay to leave detailed message).

## 2023-07-16 NOTE — Telephone Encounter (Signed)
 Spoke with Othel and asked her was Ms. Facer workers comp case close she said she hasn't gotten a clear answer from her yet due to her wanting to talk to her lawyer she did inform me she will call Ms. Layfield and let her\k know if her workers comp case is close then she will be paying out of pocket moving forward said she will call back once she hears from the patient made sure she had our call back number [phone] thanked me for the call

## 2023-08-01 ENCOUNTER — Telehealth: Payer: Self-pay

## 2023-08-01 NOTE — Telephone Encounter (Signed)
 Gwendolyn and the patient are calling In regards to let the office know that her WC case In now close and she will be paying out of pocket.

## 2023-09-06 ENCOUNTER — Telehealth: Payer: Self-pay | Admitting: Retina Specialist

## 2023-09-06 NOTE — Telephone Encounter (Signed)
 Patient requesting sooner appointment.     Reason for sooner appointment:  Patient is requesting an appointment on 09-10-2023 morning.     Tentatively scheduled with Provider: Dr. Venus at Date/Time: 09-12-2023 at 11 am  Location: [facility] .    Please assist.

## 2023-09-06 NOTE — Telephone Encounter (Signed)
 Appointment is not available on 8/26. Patient has been added to wait list. Will call if something sooner opens up.    No further action required.

## 2023-09-11 NOTE — Progress Notes (Unsigned)
 Here to check for retinal evaluation with a history of trauma and surgery in the left eye    Past Medical History:   Diagnosis Date    Retinal detachment      _______________________________________    No diagnosis found.        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, apakia, aniridia, 360 retinectomy, retina flat with some translocation of macula towards inferior and atrophy same as before, ERM?, no heme, IOP 10 mmHg  No SO emulsification  Retina flat with hazy media, no hemorrhage, extensive CRS    Plan: Observe, considering SOR in the future in case of being stable for a while and consider possible PKP at the same time    Cont Pred forte  BID, IP is stable today     Patient desires to continue as is and not doing the surgery due to limited outcome    I mentioned to her the importance of being observed due to having SO in the eye and possibility of even complete visual loss in case of SO emulsification  She is hesitant to come back every 3-4 months, but I advised her to be compliant with that.    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 Melia    # Ptosis of left eyelid  # s/p LELA 01/06/2020 by Dr Deri:   h/o prior trauma and multiple prior surgeries  - healing well, improved MRD1  # LUL entropion   - mild monitor for now  - counseling at length, PRN     ________________________________________   Extended Ophthalmoscopy 09/12/23  by slit lamp and indirect methods reveals:    Base Eye Exam       Visual Acuity (Snellen - Linear)         Right Left    Dist sc 20/25 HM              Tonometry (Tonopen, 11:13 AM)  Right Left    Pressure 17 13              Pupils         Dark Light    Right 3 2    Left no view               Neuro/Psych       Oriented x3: Yes    Mood/Affect: Normal              Dilation       Both eyes: 1.0% Mydriacyl, 2.5% Neo Synephrine @ 11:19 AM                  Slit Lamp and Fundus Exam       External Exam         Right Left    External pigmented skin on R brow LXT              Slit Lamp Exam         Right Left    Lids/Lashes Normal for Age healing well, tr edema, improved MRD1    Conjunctiva/Sclera White and quiet, no pigmentary changes White and quiet    Cornea Clear Band keratopathy with some haziness    Anterior Chamber Deep and quiet tr cell/pigment    Iris Round, no rubeosis no visible iris tissue    Lens Clear aphakia    Anterior Vitreous Clear SO not emulsified              Fundus Exam         Right Left    Disc good rim, no disc heme hazy view    C/D Ratio 0.7     Macula Normal contour and reflex for age, no RT/RD, VMA stable Flat retina, no hemorrhage    Vessels Normal Normal     Periphery Flat and attached 360 degrees 360 retinectomy w laser, extensive chorioretinal scars, no heme, fibrosis in the border same as before, no tractions                  - See Assesment/plan above    Follow-up plan:  Riazi  OCTm Fundus photo FA ICG FAF OCT  RNFL Bscan Other  Injection Laser   4 Months  ou                 I reviewed and confirmed the techs ROS, past histories, and readings.  I saw and examined the patient and reviewed in detail the findings of the technician/resident/fellow and edited it as needed to conform with my findings and impression and plan. The final examination findings, image interpretations, and plan as documented in the record represent my personal judgment and conclusions.    I have reviewed the medications    Based on todays exam, diagnostic studies, and review of records, the determination was made for or against any treatments listed above in the plan.    Emery Maltese MD.

## 2023-09-12 ENCOUNTER — Encounter: Payer: Self-pay | Admitting: Retina Specialist

## 2023-09-12 ENCOUNTER — Ambulatory Visit: Payer: Self-pay | Attending: Retina Specialist | Admitting: Retina Specialist

## 2023-09-12 DIAGNOSIS — H5462 Unqualified visual loss, left eye, normal vision right eye: Secondary | ICD-10-CM | POA: Insufficient documentation

## 2023-09-12 DIAGNOSIS — S0532XA Ocular laceration without prolapse or loss of intraocular tissue, left eye, initial encounter: Secondary | ICD-10-CM | POA: Insufficient documentation

## 2023-09-12 DIAGNOSIS — H33052 Total retinal detachment, left eye: Secondary | ICD-10-CM | POA: Insufficient documentation

## 2023-09-12 DIAGNOSIS — S0532XD Ocular laceration without prolapse or loss of intraocular tissue, left eye, subsequent encounter: Secondary | ICD-10-CM | POA: Insufficient documentation

## 2023-09-12 MED ORDER — PREDNISOLONE ACETATE 1 % OP SUSP
1.0000 [drp] | Freq: Two times a day (BID) | OPHTHALMIC | 11 refills | Status: AC
Start: 2023-09-12 — End: ?

## 2023-11-06 ENCOUNTER — Ambulatory Visit: Payer: Worker's Comp, Other unspecified | Admitting: Ophthalmology

## 2023-11-13 NOTE — Progress Notes (Deleted)
 ICD-10-CM ICD-9-CM   1. 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 Ruptured globe of left eye, subsequent encounter  S05.32XD V58.89     871.0     Patient referred by Dr. Lavell Islam.   -Family history of glaucoma? none  -Prior eye trauma? Yes, OS, injury at work with spinning part from printer hitting left eye     -Non-glaucoma ocular/systemic history:   - none    -Prior eye surgery:   - rupture globe repair OS 2021     -Eyedrop intolerance: none known  - 543/583 pachymetry  -steroid? 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   - stable glaucoma testing  - OS has band k f/b Dr. Nedra Hai  Repeat testing in next visit and if stable will follow annually for right eye    I reviewed and confirmed the techs ROS, past histories, and readings.  I saw and examined the patient and reviewed in detail the findings of the resident/fellow. The final examination findings, image interpretations, and plan as documented in the record represent my personal judgment and conclusions.

## 2023-11-20 ENCOUNTER — Ambulatory Visit: Payer: Self-pay | Admitting: Glaucoma Specialist

## 2023-11-20 DIAGNOSIS — S0532XD Ocular laceration without prolapse or loss of intraocular tissue, left eye, subsequent encounter: Secondary | ICD-10-CM

## 2023-11-20 DIAGNOSIS — H40001 Preglaucoma, unspecified, right eye: Secondary | ICD-10-CM

## 2023-12-26 ENCOUNTER — Ambulatory Visit: Payer: Self-pay | Admitting: Retina Specialist
